# Patient Record
Sex: Female | Born: 1935 | Race: White | Hispanic: No | Smoking: Never smoker
Health system: Southern US, Community
[De-identification: ages and names within clinical notes are randomized; demographics above are authoritative.]

## PROBLEM LIST (undated history)

## (undated) DIAGNOSIS — I639 Cerebral infarction, unspecified: Secondary | ICD-10-CM

## (undated) DIAGNOSIS — I341 Nonrheumatic mitral (valve) prolapse: Secondary | ICD-10-CM

## (undated) DIAGNOSIS — I10 Essential (primary) hypertension: Secondary | ICD-10-CM

## (undated) DIAGNOSIS — M069 Rheumatoid arthritis, unspecified: Secondary | ICD-10-CM

## (undated) DIAGNOSIS — S065X9A Traumatic subdural hemorrhage with loss of consciousness of unspecified duration, initial encounter: Secondary | ICD-10-CM

## (undated) DIAGNOSIS — S065XAA Traumatic subdural hemorrhage with loss of consciousness status unknown, initial encounter: Secondary | ICD-10-CM

## (undated) DIAGNOSIS — IMO0002 Reserved for concepts with insufficient information to code with codable children: Secondary | ICD-10-CM

## (undated) HISTORY — PX: OTHER SURGICAL HISTORY: SHX169

---

## 2008-09-18 ENCOUNTER — Encounter: Payer: Self-pay | Admitting: Cardiology

## 2008-10-06 ENCOUNTER — Encounter: Payer: Self-pay | Admitting: Cardiology

## 2008-10-07 DIAGNOSIS — I4949 Other premature depolarization: Secondary | ICD-10-CM

## 2008-10-09 ENCOUNTER — Ambulatory Visit: Payer: Self-pay | Admitting: Cardiology

## 2008-10-09 ENCOUNTER — Ambulatory Visit: Payer: Self-pay

## 2008-10-09 ENCOUNTER — Encounter: Payer: Self-pay | Admitting: Cardiology

## 2008-10-20 ENCOUNTER — Ambulatory Visit: Payer: Self-pay | Admitting: Cardiology

## 2008-10-20 ENCOUNTER — Encounter (INDEPENDENT_AMBULATORY_CARE_PROVIDER_SITE_OTHER): Payer: Self-pay | Admitting: *Deleted

## 2008-10-20 DIAGNOSIS — R002 Palpitations: Secondary | ICD-10-CM

## 2008-11-23 ENCOUNTER — Encounter (INDEPENDENT_AMBULATORY_CARE_PROVIDER_SITE_OTHER): Payer: Self-pay | Admitting: *Deleted

## 2008-12-21 ENCOUNTER — Ambulatory Visit: Payer: Self-pay | Admitting: Cardiology

## 2011-09-01 ENCOUNTER — Encounter (HOSPITAL_COMMUNITY)
Admission: AD | Disposition: A | Discharge status: Rehabilitation Facility | Payer: Self-pay | Source: Other Acute Inpatient Hospital | Attending: Neurological Surgery

## 2011-09-01 ENCOUNTER — Inpatient Hospital Stay (HOSPITAL_COMMUNITY): Payer: Medicare Other | Admitting: Certified Registered"

## 2011-09-01 ENCOUNTER — Encounter (HOSPITAL_COMMUNITY): Payer: Self-pay | Admitting: Surgery

## 2011-09-01 ENCOUNTER — Inpatient Hospital Stay (HOSPITAL_COMMUNITY)
Admission: AD | Admit: 2011-09-01 | Discharge: 2011-09-06 | DRG: 027 | Disposition: A | Discharge status: Rehabilitation Facility | Payer: Medicare Other | Source: Other Acute Inpatient Hospital | Attending: Neurological Surgery | Admitting: Neurological Surgery

## 2011-09-01 ENCOUNTER — Encounter (HOSPITAL_COMMUNITY): Payer: Self-pay | Admitting: Certified Registered"

## 2011-09-01 ENCOUNTER — Encounter (HOSPITAL_COMMUNITY): Payer: Self-pay | Admitting: Neurological Surgery

## 2011-09-01 DIAGNOSIS — I6200 Nontraumatic subdural hemorrhage, unspecified: Principal | ICD-10-CM | POA: Diagnosis present

## 2011-09-01 DIAGNOSIS — R4789 Other speech disturbances: Secondary | ICD-10-CM | POA: Diagnosis present

## 2011-09-01 DIAGNOSIS — R29810 Facial weakness: Secondary | ICD-10-CM | POA: Diagnosis present

## 2011-09-01 DIAGNOSIS — Z8673 Personal history of transient ischemic attack (TIA), and cerebral infarction without residual deficits: Secondary | ICD-10-CM

## 2011-09-01 HISTORY — DX: Nonrheumatic mitral (valve) prolapse: I34.1

## 2011-09-01 HISTORY — DX: Cerebral infarction, unspecified: I63.9

## 2011-09-01 HISTORY — DX: Traumatic subdural hemorrhage with loss of consciousness status unknown, initial encounter: S06.5XAA

## 2011-09-01 HISTORY — DX: Traumatic subdural hemorrhage with loss of consciousness of unspecified duration, initial encounter: S06.5X9A

## 2011-09-01 HISTORY — PX: BURR HOLE: SHX908

## 2011-09-01 HISTORY — DX: Essential (primary) hypertension: I10

## 2011-09-01 LAB — POCT I-STAT 4, (NA,K, GLUC, HGB,HCT): Glucose, Bld: 78 mg/dL (ref 70–99)

## 2011-09-01 LAB — MRSA PCR SCREENING: MRSA by PCR: NEGATIVE

## 2011-09-01 LAB — CBC
MCH: 30.5 pg (ref 26.0–34.0)
Platelets: 178 10*3/uL (ref 150–400)
RBC: 3.87 MIL/uL (ref 3.87–5.11)
RDW: 13 % (ref 11.5–15.5)
WBC: 5.5 10*3/uL (ref 4.0–10.5)

## 2011-09-01 LAB — COMPREHENSIVE METABOLIC PANEL
Albumin: 3.4 g/dL — ABNORMAL LOW (ref 3.5–5.2)
Alkaline Phosphatase: 62 U/L (ref 39–117)
BUN: 15 mg/dL (ref 6–23)
Calcium: 9.1 mg/dL (ref 8.4–10.5)
Potassium: 2.8 mEq/L — ABNORMAL LOW (ref 3.5–5.1)
Total Protein: 6.7 g/dL (ref 6.0–8.3)

## 2011-09-01 LAB — PROTIME-INR: Prothrombin Time: 16.3 seconds — ABNORMAL HIGH (ref 11.6–15.2)

## 2011-09-01 SURGERY — CREATION, CRANIAL BURR HOLE
Anesthesia: General | Site: Head | Laterality: Left | Wound class: Clean

## 2011-09-01 MED ORDER — PANTOPRAZOLE SODIUM 40 MG IV SOLR
40.0000 mg | Freq: Every day | INTRAVENOUS | Status: DC
  Administered 2011-09-01 – 2011-09-05 (×5): 40 mg via INTRAVENOUS
  Filled 2011-09-01 (×6): qty 40

## 2011-09-01 MED ORDER — MORPHINE SULFATE 2 MG/ML IJ SOLN
1.0000 mg | INTRAMUSCULAR | Status: DC | PRN
  Administered 2011-09-02 – 2011-09-03 (×2): 2 mg via INTRAVENOUS
  Filled 2011-09-01 (×2): qty 1

## 2011-09-01 MED ORDER — POTASSIUM CHLORIDE IN NACL 20-0.9 MEQ/L-% IV SOLN
INTRAVENOUS | Status: DC
  Administered 2011-09-01 – 2011-09-05 (×8): via INTRAVENOUS
  Filled 2011-09-01 (×10): qty 1000

## 2011-09-01 MED ORDER — 0.9 % SODIUM CHLORIDE (POUR BTL) OPTIME
TOPICAL | Status: DC | PRN
  Administered 2011-09-01 (×2): 1000 mL

## 2011-09-01 MED ORDER — LABETALOL HCL 5 MG/ML IV SOLN
10.0000 mg | INTRAVENOUS | Status: DC | PRN
  Administered 2011-09-01 – 2011-09-02 (×3): 10 mg via INTRAVENOUS
  Administered 2011-09-02: 20 mg via INTRAVENOUS
  Administered 2011-09-02 – 2011-09-04 (×3): 10 mg via INTRAVENOUS
  Filled 2011-09-01 (×7): qty 4

## 2011-09-01 MED ORDER — PROPOFOL 10 MG/ML IV BOLUS
INTRAVENOUS | Status: DC | PRN
  Administered 2011-09-01: 100 mg via INTRAVENOUS

## 2011-09-01 MED ORDER — ACETAMINOPHEN 650 MG RE SUPP: 650.0000 mg | RECTAL | Status: DC | PRN

## 2011-09-01 MED ORDER — THROMBIN 20000 UNITS EX KIT
PACK | CUTANEOUS | Status: DC | PRN
  Administered 2011-09-01: 16:00:00 via TOPICAL

## 2011-09-01 MED ORDER — CEFAZOLIN SODIUM 1-5 GM-% IV SOLN
INTRAVENOUS | Status: DC | PRN
  Administered 2011-09-01: 2 g via INTRAVENOUS

## 2011-09-01 MED ORDER — FENTANYL CITRATE 0.05 MG/ML IJ SOLN
INTRAMUSCULAR | Status: DC | PRN
  Administered 2011-09-01: 25 ug via INTRAVENOUS

## 2011-09-01 MED ORDER — LIDOCAINE-EPINEPHRINE 1 %-1:100000 IJ SOLN
INTRAMUSCULAR | Status: DC | PRN
  Administered 2011-09-01: 8 mL

## 2011-09-01 MED ORDER — EPHEDRINE SULFATE 50 MG/ML IJ SOLN
INTRAMUSCULAR | Status: DC | PRN
  Administered 2011-09-01: 10 mg via INTRAVENOUS

## 2011-09-01 MED ORDER — ONDANSETRON HCL 4 MG/2ML IJ SOLN
INTRAMUSCULAR | Status: DC | PRN
  Administered 2011-09-01: 4 mg via INTRAVENOUS

## 2011-09-01 MED ORDER — HALOPERIDOL LACTATE 5 MG/ML IJ SOLN
2.0000 mg | Freq: Once | INTRAMUSCULAR | Status: AC
  Administered 2011-09-01: 2 mg via INTRAVENOUS

## 2011-09-01 MED ORDER — TRIAMTERENE-HCTZ 37.5-25 MG PO TABS
1.0000 | ORAL_TABLET | Freq: Every day | ORAL | Status: DC
  Administered 2011-09-04 – 2011-09-06 (×3): 1 via ORAL
  Filled 2011-09-01 (×6): qty 1

## 2011-09-01 MED ORDER — POTASSIUM CHLORIDE 10 MEQ/100ML IV SOLN
10.0000 meq | INTRAVENOUS | Status: AC
  Administered 2011-09-01 (×3): 10 meq via INTRAVENOUS
  Filled 2011-09-01 (×3): qty 100

## 2011-09-01 MED ORDER — LACTATED RINGERS IV SOLN
INTRAVENOUS | Status: DC | PRN
  Administered 2011-09-01: 16:00:00 via INTRAVENOUS

## 2011-09-01 MED ORDER — LIDOCAINE HCL (CARDIAC) 20 MG/ML IV SOLN
INTRAVENOUS | Status: DC | PRN
  Administered 2011-09-01: 40 mg via INTRAVENOUS

## 2011-09-01 MED ORDER — SODIUM CHLORIDE 0.9 % IV SOLN
INTRAVENOUS | Status: DC
  Administered 2011-09-01: 04:00:00 via INTRAVENOUS

## 2011-09-01 MED ORDER — ACETAMINOPHEN 325 MG PO TABS: 650.0000 mg | ORAL_TABLET | ORAL | Status: DC | PRN

## 2011-09-01 MED ORDER — LEVOTHYROXINE SODIUM 75 MCG PO TABS
75.0000 ug | ORAL_TABLET | Freq: Every day | ORAL | Status: DC
  Administered 2011-09-04 – 2011-09-06 (×3): 75 ug via ORAL
  Filled 2011-09-01 (×8): qty 1

## 2011-09-01 MED ORDER — ONDANSETRON HCL 4 MG/2ML IJ SOLN: 4.0000 mg | Freq: Four times a day (QID) | INTRAMUSCULAR | Status: DC | PRN

## 2011-09-01 MED ORDER — SENNOSIDES-DOCUSATE SODIUM 8.6-50 MG PO TABS
1.0000 | ORAL_TABLET | Freq: Two times a day (BID) | ORAL | Status: DC
  Administered 2011-09-03 – 2011-09-06 (×5): 1 via ORAL
  Filled 2011-09-01 (×11): qty 1

## 2011-09-01 MED ORDER — HALOPERIDOL LACTATE 5 MG/ML IJ SOLN
INTRAMUSCULAR | Status: AC
  Administered 2011-09-01: 2 mg via INTRAVENOUS
  Filled 2011-09-01: qty 1

## 2011-09-01 SURGICAL SUPPLY — 53 items
BAG DECANTER FOR FLEXI CONT (MISCELLANEOUS) ×2 IMPLANT
BANDAGE GAUZE 4  KLING STR (GAUZE/BANDAGES/DRESSINGS) IMPLANT
BANDAGE GAUZE ELAST BULKY 4 IN (GAUZE/BANDAGES/DRESSINGS) IMPLANT
BENZOIN TINCTURE PRP APPL 2/3 (GAUZE/BANDAGES/DRESSINGS) IMPLANT
BLADE CLIPPER SURG NEURO (BLADE) ×2 IMPLANT
BRUSH SCRUB EZ 1% IODOPHOR (MISCELLANEOUS) IMPLANT
BUR ACORN 6.0 PRECISION (BURR) ×2 IMPLANT
CANISTER SUCTION 2500CC (MISCELLANEOUS) ×2 IMPLANT
CATH VENTRIC 35X38 W/TROCAR LG (CATHETERS) ×2 IMPLANT
CLIP TI MEDIUM 6 (CLIP) IMPLANT
CLOTH BEACON ORANGE TIMEOUT ST (SAFETY) ×2 IMPLANT
CONT SPEC 4OZ CLIKSEAL STRL BL (MISCELLANEOUS) ×2 IMPLANT
CORDS BIPOLAR (ELECTRODE) ×2 IMPLANT
DRAIN SNY WOU 7FLT (WOUND CARE) IMPLANT
DRAPE NEUROLOGICAL W/INCISE (DRAPES) ×2 IMPLANT
DRESSING TELFA 8X3 (GAUZE/BANDAGES/DRESSINGS) IMPLANT
DRSG OPSITE 4X5.5 SM (GAUZE/BANDAGES/DRESSINGS) ×2 IMPLANT
DURAPREP 26ML APPLICATOR (WOUND CARE) ×2 IMPLANT
DURAPREP 6ML APPLICATOR 50/CS (WOUND CARE) ×2 IMPLANT
ELECT CAUTERY BLADE 6.4 (BLADE) ×2 IMPLANT
ELECT REM PT RETURN 9FT ADLT (ELECTROSURGICAL) ×2
ELECTRODE REM PT RTRN 9FT ADLT (ELECTROSURGICAL) ×1 IMPLANT
EVACUATOR SILICONE 100CC (DRAIN) IMPLANT
GAUZE SPONGE 4X4 16PLY XRAY LF (GAUZE/BANDAGES/DRESSINGS) IMPLANT
GLOVE BIO SURGEON STRL SZ 6.5 (GLOVE) ×4 IMPLANT
GLOVE BIO SURGEON STRL SZ8 (GLOVE) ×2 IMPLANT
GLOVE BIOGEL PI IND STRL 7.0 (GLOVE) ×1 IMPLANT
GLOVE BIOGEL PI INDICATOR 7.0 (GLOVE) ×1
GOWN BRE IMP SLV AUR LG STRL (GOWN DISPOSABLE) ×2 IMPLANT
GOWN BRE IMP SLV AUR XL STRL (GOWN DISPOSABLE) ×2 IMPLANT
GOWN STRL REIN 2XL LVL4 (GOWN DISPOSABLE) ×2 IMPLANT
HEMOSTAT POWDER KIT SURGIFOAM (HEMOSTASIS) IMPLANT
KIT BASIN OR (CUSTOM PROCEDURE TRAY) ×2 IMPLANT
KIT DRAIN CSF ACCUDRAIN (MISCELLANEOUS) ×2 IMPLANT
KIT ROOM TURNOVER OR (KITS) ×2 IMPLANT
NEEDLE HYPO 22GX1.5 SAFETY (NEEDLE) ×2 IMPLANT
NS IRRIG 1000ML POUR BTL (IV SOLUTION) ×4 IMPLANT
PACK CRANIOTOMY (CUSTOM PROCEDURE TRAY) ×2 IMPLANT
PAD ARMBOARD 7.5X6 YLW CONV (MISCELLANEOUS) ×4 IMPLANT
PIN MAYFIELD SKULL DISP (PIN) IMPLANT
SPECIMEN JAR SMALL (MISCELLANEOUS) IMPLANT
SPONGE GAUZE 4X4 12PLY (GAUZE/BANDAGES/DRESSINGS) ×2 IMPLANT
STAPLER VISISTAT 35W (STAPLE) ×2 IMPLANT
SUT ETHILON 3 0 PS 1 (SUTURE) ×2 IMPLANT
SUT NURALON 4 0 TR CR/8 (SUTURE) ×2 IMPLANT
SUT VIC AB 2-0 CP2 18 (SUTURE) ×2 IMPLANT
SYR 20ML ECCENTRIC (SYRINGE) ×2 IMPLANT
SYR CONTROL 10ML LL (SYRINGE) ×2 IMPLANT
TOWEL OR 17X24 6PK STRL BLUE (TOWEL DISPOSABLE) ×2 IMPLANT
TOWEL OR 17X26 10 PK STRL BLUE (TOWEL DISPOSABLE) ×2 IMPLANT
TRAY FOLEY CATH 14FRSI W/METER (CATHETERS) IMPLANT
UNDERPAD 30X30 INCONTINENT (UNDERPADS AND DIAPERS) IMPLANT
WATER STERILE IRR 1000ML POUR (IV SOLUTION) ×2 IMPLANT

## 2011-09-01 NOTE — Anesthesia Procedure Notes (Addendum)
Procedure Name: LMA Insertion Date/Time: 09/01/2011 4:05 PM Performed by: Jerilee Hoh Pre-anesthesia Checklist: Patient identified, Emergency Drugs available, Suction available and Patient being monitored Patient Re-evaluated:Patient Re-evaluated prior to inductionOxygen Delivery Method: Circle system utilized Intubation Type: IV induction LMA: LMA inserted LMA Size: 4.0 Tube type: Oral Number of attempts: 1 Placement Confirmation: positive ETCO2 and breath sounds checked- equal and bilateral Tube secured with: Tape Dental Injury: Teeth and Oropharynx as per pre-operative assessment  Comments: Patient confused and combative preop.  Unable to apply monitors prior to induction and unable to preoxygenate.  Monitors applied immediately after induction and prior to LMA insertion.  VSS.  Atraumatic LMA placement.

## 2011-09-01 NOTE — Op Note (Signed)
09/01/2011  4:59 PM  PATIENT:  [name]  78 y.o. female  PRE-OPERATIVE DIAGNOSIS:  Left chronic subdural hematoma  POST-OPERATIVE DIAGNOSIS:  Same  PROCEDURE:  Left frontal bur hole with placement of subdural drain  SURGEON:  Marikay Alar, MD  ASSISTANTS: none  ANESTHESIA:   General  EBL: Minimal ml  Total I/O In: 843.8 [I.V.:543.8; IV Piggyback:300] Out: 400 [Urine:400]  BLOOD ADMINISTERED:none  DRAINS: Subdural drain   SPECIMEN:  No Specimen  INDICATION FOR PROCEDURE: This is a patient who presented after falling about 3 months ago. She had a CT scan done at [facility] by month ago showed bilateral chronic subdural hematomas. She began to become symptomatic from these with intermittent difficulty with speech and headache. Followup CT confirm the presence of minimally increased bilateral subdural hematomas. Recommended a left-sided burr hole for subdural hematoma. Patient understood the risks, benefits, and alternatives and potential outcomes and wished to proceed.  PROCEDURE DETAILS: The patient was taken to the operating room and after induction of adequate generalized endotracheal anesthesia, the head was turned to the  right to expose the left frontotemporal parietal region. The head was shaved and then cleaned and then prepped with DuraPrep and draped in the usual sterile fashion. 10 cc of local anesthetic was injected, and a small linear incision was made on the left side of the head in the frontotemporal region. Hemostasis was obtained and a small self-retaining retractor was used. A burr hole was placed with a air powered drill and then enlarged with a small Kerrison punch. The dura was opened and A hematoma was then removed with a combination of irrigation and suction. I continued to irrigate until the irrigant was clear, and dried any bleeding with bipolar cautery. I fenestrated the membranes until the cortex was visible. I then placed a subdural drain through  separate stab incision.  The dura was lined with Gelfoam.  the galea was then closed with interrupted 2-0 Vicryl suture. The skin was then closed with staples a sterile dressing was applied. She was awakened from general anesthesia, and transported to the recovery room in stable condition. At the end of the procedure all sponge, needle, and instrument counts were correct.   PLAN OF CARE: Admit to inpatient   PATIENT DISPOSITION:  PACU - hemodynamically stable.   Delay start of Pharmacological VTE agent (>24hrs) due to surgical blood loss or risk of bleeding:  yes

## 2011-09-01 NOTE — Progress Notes (Signed)
INITIAL ADULT NUTRITION ASSESSMENT Date: 09/01/2011   Time: 10:50 AM Reason for Assessment: Nutrition Risk - weight loss/appears malnourished  INTERVENTION: Supplement diet once advanced.    ASSESSMENT: Female 78 y.o.  Dx: Bilateral subdural hematomas  Hx: No past medical history on file. No past surgical history on file.  Related Meds:     . pantoprazole (PROTONIX) IV  40 mg Intravenous QHS  . potassium chloride  10 mEq Intravenous Q1 Hr x 3  . senna-docusate  1 tablet Oral BID   Ht: 5\' 3"  (160 cm)  Wt: 100 lb 8.5 oz (45.6 kg)  Ideal Wt: 52.2 kg % Ideal Wt: 87%  Usual Wt:  Wt Readings from Last 10 Encounters:  09/01/11 100 lb 8.5 oz (45.6 kg)  09/01/11 100 lb 8.5 oz (45.6 kg)  12/21/08 115 lb (52.164 kg)  10/20/08 119 lb (53.978 kg)  10/09/08 115 lb (52.164 kg)    % Usual Wt:    Body mass index is 17.81 kg/(m^2). Underweight  Food/Nutrition Related Hx: Unable to determine at this time. Pt is confused and unable to answer any questions. Pt does appear to have temporal wasting. No family present at this time.   Labs:  CMP     Component Value Date/Time   NA 144 09/01/2011 0520   K 2.8* 09/01/2011 0520   CL 105 09/01/2011 0520   CO2 30 09/01/2011 0520   GLUCOSE 89 09/01/2011 0520   BUN 15 09/01/2011 0520   CREATININE 0.69 09/01/2011 0520   CALCIUM 9.1 09/01/2011 0520   PROT 6.7 09/01/2011 0520   ALBUMIN 3.4* 09/01/2011 0520   AST 20 09/01/2011 0520   ALT 9 09/01/2011 0520   ALKPHOS 62 09/01/2011 0520   BILITOT 0.5 09/01/2011 0520   GFRNONAA 78* 09/01/2011 0520   GFRAA >90 09/01/2011 0520  CBG (last 3)  No results found for this basename: GLUCAP:3 in the last 72 hours  Intake/Output Summary (Last 24 hours) at 09/01/11 1058 Last data filed at 09/01/11 0905  Gross per 24 hour  Intake 211.67 ml  Output      0 ml  Net 211.67 ml    Diet Order: NPO  Supplements/Tube Feeding: none  IVF:    sodium chloride Last Rate: 50 mL/hr at 09/01/11 0800  0.9 % NaCl with KCl 20 mEq / L  Last Rate: 75 mL/hr at 09/01/11 0905   Pt with bilateral chronic subdural hematomas. Per RN plan for burr hole today in the OR after MD can discuss with family.   Estimated Nutritional Needs:   Kcal:  1300-1500 Protein:  65-75 grams Fluid:  >1.5 L/day  NUTRITION DIAGNOSIS: -Inadequate oral intake (NI-2.1).  Status: Ongoing  RELATED TO: inability to eat  AS EVIDENCE BY: NPO status  MONITORING/EVALUATION(Goals): Goal: Pt to meet >/= 90% of their estimated nutrition needs. Monitor: Diet advancement/tolerance, PO intake  EDUCATION NEEDS: -No education needs identified at this time   DOCUMENTATION CODES Per approved criteria  -Underweight    Kendell Bane RD, LDN, CNSC [phone] Pager [phone] After Hours Pager  09/01/2011, 10:50 AM

## 2011-09-01 NOTE — Anesthesia Postprocedure Evaluation (Signed)
  Anesthesia Post-op Note  Patient: [name]  Procedure(s) Performed: Procedure(s) (LRB): BURR HOLES (Left)  Patient Location: PACU  Anesthesia Type: General  Level of Consciousness: awake, pateint uncooperative and confused  Airway and Oxygen Therapy: Patient Spontanous Breathing  Post-op Pain: none  Post-op Assessment: Post-op Vital signs reviewed, Patient's Cardiovascular Status Stable, Respiratory Function Stable, Patent Airway and No signs of Nausea or vomiting  Post-op Vital Signs: Reviewed and stable  Complications: No apparent anesthesia complications

## 2011-09-01 NOTE — Progress Notes (Signed)
Pt taken to OR for Left burr holes, report given to [facility]. Family in waiting room. [place], [place] M

## 2011-09-01 NOTE — Anesthesia Preprocedure Evaluation (Addendum)
Anesthesia Evaluation  Patient identified by MRN, date of birth, ID band Patient confused    Reviewed: Allergy & Precautions, H&P , NPO status , Patient's Chart, lab work & pertinent test results, reviewed documented beta blocker date and time   Airway Mallampati: II TM Distance: >3 FB Neck ROM: Full    Dental No notable dental hx. (+) Teeth Intact and Dental Advisory Given   Pulmonary neg pulmonary ROS,    Pulmonary exam normal       Cardiovascular hypertension, Pt. on medications negative cardio ROS      Neuro/Psych Subdural hematoma Pt. confused CVA, Residual Symptoms negative psych ROS   GI/Hepatic negative GI ROS, Neg liver ROS,   Endo/Other  negative endocrine ROS  Renal/GU negative Renal ROS  negative genitourinary   Musculoskeletal   Abdominal   Peds  Hematology negative hematology ROS (+)   Anesthesia Other Findings   Reproductive/Obstetrics negative OB ROS                          Anesthesia Physical Anesthesia Plan  ASA: III  Anesthesia Plan: General   Post-op Pain Management:    Induction: Intravenous  Airway Management Planned: LMA  Additional Equipment:   Intra-op Plan:   Post-operative Plan: Extubation in OR  Informed Consent: I have reviewed the patients History and Physical, chart, labs and discussed the procedure including the risks, benefits and alternatives for the proposed anesthesia with the patient or authorized representative who has indicated his/her understanding and acceptance.   Dental advisory given  Plan Discussed with: Anesthesiologist and Surgeon  Anesthesia Plan Comments:         Anesthesia Quick Evaluation

## 2011-09-01 NOTE — H&P (Signed)
Reason for Consult: Bilateral subdural hematomas Referring Physician: EDP  [name] is an 78 y.o. female.   HPI:  78 year old female who had a previous fall. She was seen in [facility] about a month ago and a CT scan of the head showed bilateral small chronic subdural hematomas. Appears today she had some headache and facial droop and difficulty with speech and was taken back to the [facility] emergency department. Head CT revealed a bilateral chronic subdural hematomas. I was called and asked if I would take care of her by Dr. Juanito Doom, who is a family friend. She was transferred for neurosurgical care. She denies weakness but does complain of some headache in the frontal region.  No past medical history on file.  No past surgical history on file.  Not on File  History  Substance Use Topics  . Smoking status: Not on file  . Smokeless tobacco: Not on file  . Alcohol Use: Not on file    No family history on file.   Review of Systems  Positive ROS: neg  All other systems have been reviewed and were otherwise negative with the exception of those mentioned in the HPI and as above.  Objective: Vital signs in last 24 hours: Temp:  [98.5 F (36.9 C)] 98.5 F (36.9 C) (08/09 0233) Pulse Rate:  [54-58] 54  (08/09 0315) Resp:  [12-18] 17  (08/09 0315) BP: (157-176)/(61-83) 172/61 mmHg (08/09 0315) SpO2:  [96 %-97 %] 96 % (08/09 0315) Weight:  [45.6 kg (100 lb 8.5 oz)] 45.6 kg (100 lb 8.5 oz) (08/09 0233)  General Appearance: Alert, cooperative, no distress, appears stated age Head: Normocephalic, without obvious abnormality, atraumatic Eyes: PERRL, conjunctiva/corneas clear, EOM's intact     Ears: Normal TM's and external ear canals, both ears Throat: benign Neck: Supple, symmetrical Lungs: Clear to auscultation bilaterally, respirations unlabored Heart: Regular rate and rhythm, S1 and S2 normal, no murmur, rub or gallop Abdomen: Soft,  non-tender Extremities: Extremities normal, atraumatic, no cyanosis or edema Pulses: 2+ and symmetric all extremities Skin: Skin color, texture, turgor normal, no rashes or lesions  NEUROLOGIC:   Mental status: A&O x4, no significant aphasia at present with fair naming good fluency good repeating, good attention span, Memory and fund of knowledge appear okay Motor Exam - grossly normal, normal tone and bulk for age, no drift Sensory Exam - grossly normal Reflexes: symmetric, no pathologic reflexes, No Hoffman's, No clonus Coordination - grossly normal Gait -not tested Balance - not tested Cranial Nerves: I: smell Not tested  II: visual acuity  OS: na    OD: na  II: visual fields Full to confrontation  II: pupils Equal, round, reactive to light  III,VII: ptosis None  III,IV,VI: extraocular muscles  Full ROM  V: mastication Normal  V: facial light touch sensation  Normal  V,VII: corneal reflex  Present  VII: facial muscle function - upper  Normal  VII: facial muscle function - lower Normal  VIII: hearing Not tested  IX: soft palate elevation  Normal  IX,X: gag reflex Present  XI: trapezius strength  5/5  XI: sternocleidomastoid strength 5/5  XI: neck flexion strength  5/5  XII: tongue strength  Normal    Data Review No results found for this basename: WBC, HGB, HCT, MCV, PLT   No results found for this basename: NA, K, CL, CO2, BUN, creatinine, glucose   No results found for this basename: INR, PROTIME    Radiology: No results found. CT  scan: Shows bilateral chronic subdural hematomas left greater than right measuring about 12 mm with some local mass effect but no midline shift, basal cisterns are open  Assessment/Plan: This is an elderly lady with bilateral chronic subdural hematomas. Appears she has some intermittent difficulty with speech and headaches. This probably requires a burr hole for drainage though I don't think it needs to be done emergently and there is no  family available at this time to discuss this matter. Will discuss with family when they arrive and determine our treatment plan, but I would favor either a left-sided burr hole or bilateral bur holes for drainage, potentially later today.   [name] S 09/01/2011 3:29 AM

## 2011-09-01 NOTE — Transfer of Care (Signed)
Immediate Anesthesia Transfer of Care Note  Patient: [name]  Procedure(s) Performed: Procedure(s) (LRB): BURR HOLES (Left)  Patient Location: PACU  Anesthesia Type: General  Level of Consciousness: awake, alert , oriented and patient cooperative  Airway & Oxygen Therapy: Patient Spontanous Breathing and Patient connected to nasal cannula oxygen  Post-op Assessment: Report given to PACU RN, Post -op Vital signs reviewed and stable and Patient moving all extremities  Post vital signs: Reviewed and stable  Complications: No apparent anesthesia complications

## 2011-09-01 NOTE — Preoperative (Signed)
Beta Blockers   Reason not to administer Beta Blockers:Not Applicable 

## 2011-09-01 NOTE — Progress Notes (Signed)
UR complete 

## 2011-09-01 NOTE — Plan of Care (Signed)
Problem: Consults Goal: Diagnosis - Craniotomy Outcome: Completed/Met Date Met:  09/01/11 Bilateral Subdural hematomas. Left [name] hole

## 2011-09-01 NOTE — Progress Notes (Signed)
Dr Yetta Barre notified of pt's increase in confusion, agitation and difficulty following instructions.  No new orders given.  Will continue to monitor.  Dara Hoyer

## 2011-09-02 ENCOUNTER — Inpatient Hospital Stay (HOSPITAL_COMMUNITY): Payer: Medicare Other

## 2011-09-02 LAB — BASIC METABOLIC PANEL
BUN: 10 mg/dL (ref 6–23)
CO2: 29 mEq/L (ref 19–32)
Chloride: 104 mEq/L (ref 96–112)
Glucose, Bld: 96 mg/dL (ref 70–99)
Potassium: 3.3 mEq/L — ABNORMAL LOW (ref 3.5–5.1)
Sodium: 142 mEq/L (ref 135–145)

## 2011-09-02 NOTE — Progress Notes (Signed)
Subjective: Patient confused  Objective: Vital signs in last 24 hours: Temp:  [96.8 F (36 C)-98 F (36.7 C)] 97.4 F (36.3 C) (08/10 0800) Pulse Rate:  [47-101] 59  (08/10 0800) Resp:  [12-37] 12  (08/10 0800) BP: (135-173)/(45-101) 147/52 mmHg (08/10 0800) SpO2:  [93 %-100 %] 100 % (08/10 0800)  Intake/Output from previous day: 08/09 0701 - 08/10 0700 In: 2303.8 [I.V.:1993.8; IV Piggyback:310] Out: 1984 [Urine:1850; Drains:134] Intake/Output this shift: Total I/O In: 75 [I.V.:75] Out: 7 [Drains:7]  PE - AAOx1 (name) - moves all 4 well Wound:c/d/i, drain intact   Lab Results:  Basename 09/01/11 1551 09/01/11 0520  WBC -- 5.5  HGB 11.6* 11.8*  HCT 34.0* 35.0*  PLT -- 178   BMET  Basename 09/02/11 0435 09/01/11 1551 09/01/11 0520  NA 142 144 --  K 3.3* 3.5 --  CL 104 -- 105  CO2 29 -- 30  GLUCOSE 96 78 --  BUN 10 -- 15  CREATININE 0.71 -- 0.69  CALCIUM 8.9 -- 9.1    Studies/Results: Ct Head Wo Contrast  09/02/2011  *RADIOLOGY REPORT*  Clinical Data: Followup subdural hematoma.  CT HEAD WITHOUT CONTRAST  Technique:  Contiguous axial images were obtained from the base of the skull through the vertex without contrast.  Comparison: No comparison exams available.  The patient was imaged on the outside hospital.  Findings: Post left frontal burr hole with placement of a left subdural drain.  Small to moderate amount of left-sided pneumocephalus.  Residual left-sided subdural collection with maximal thickness of 7.3 mm.  Right-sided complex subdural hematoma with fluid level suggesting acute on top of chronic hemorrhage with maximal thickness of 13.5 mm.  Relative balanced mass effect.  Minimal bowing of the septum to the right.  Ventricular prominence.  This may be related to atrophy.  Mild component of hydrocephalus not excluded.  Attention to this as subdural hematoma is clear.  No CT evidence of large acute infarct.  No intracranial mass lesion detected on this  unenhanced exam.  IMPRESSION:  Post left frontal burr hole with placement of a left subdural drain.  Small to moderate amount of left-sided pneumocephalus. Residual left-sided subdural collection with maximal thickness of 7.3 mm.  Right-sided complex subdural hematoma with fluid level suggesting acute on top of chronic hemorrhage with maximal thickness of 13.5 mm.  Critical Value/emergent results were called by telephone at the time of interpretation on 09/02/2011 at 8:19 a.m. to [facility] patient's nurse, who verbally acknowledged these results.  Original Report Authenticated By: Fuller Canada, M.D.    Assessment/Plan: Pt confused - otherwise stable - CT looks as expected - continue drain - increase HOB today - ?start therapies in am?  LOS: 1 day     Izzy Doubek R, MD 09/02/2011, 8:46 AM

## 2011-09-03 LAB — BASIC METABOLIC PANEL
BUN: 13 mg/dL (ref 6–23)
CO2: 26 mEq/L (ref 19–32)
Chloride: 106 mEq/L (ref 96–112)
Creatinine, Ser: 0.67 mg/dL (ref 0.50–1.10)
GFR calc Af Amer: >90 mL/min (ref >90)
Glucose, Bld: 104 mg/dL — ABNORMAL HIGH (ref 70–99)
Potassium: 3.6 mEq/L (ref 3.5–5.1)

## 2011-09-03 NOTE — Progress Notes (Signed)
Subjective: Patient confused  Objective: Vital signs in last 24 hours: Temp:  [96.9 F (36.1 C)-98.7 F (37.1 C)] 98.6 F (37 C) (08/11 0400) Pulse Rate:  [27-91] 68  (08/11 0000) Resp:  [12-26] 16  (08/11 0700) BP: (136-205)/(51-116) 157/51 mmHg (08/11 0700) SpO2:  [93 %-100 %] 98 % (08/11 0000) Weight:  [45.9 kg (101 lb 3.1 oz)] 45.9 kg (101 lb 3.1 oz) (08/11 0500)  Intake/Output from previous day: 08/10 0701 - 08/11 0700 In: 1575 [I.V.:1575] Out: 699 [Urine:600; Drains:99] Intake/Output this shift:    PE - AAOx1 at times - moves all 4  Lab Results:  [facility] 09/01/11 1551 09/01/11 0520  WBC -- 5.5  HGB 11.6* 11.8*  HCT 34.0* 35.0*  PLT -- 178   BMET  Basename 09/03/11 0411 09/02/11 0435  NA 144 142  K 3.6 3.3*  CL 106 104  CO2 26 29  GLUCOSE 104* 96  BUN 13 10  CREATININE 0.67 0.71  CALCIUM 9.0 8.9    Studies/Results: Ct Head Wo Contrast  09/02/2011  *RADIOLOGY REPORT*  Clinical Data: Followup subdural hematoma.  CT HEAD WITHOUT CONTRAST  Technique:  Contiguous axial images were obtained from the base of the skull through the vertex without contrast.  Comparison: No comparison exams available.  The patient was imaged on the outside hospital.  Findings: Post left frontal burr hole with placement of a left subdural drain.  Small to moderate amount of left-sided pneumocephalus.  Residual left-sided subdural collection with maximal thickness of 7.3 mm.  Right-sided complex subdural hematoma with fluid level suggesting acute on top of chronic hemorrhage with maximal thickness of 13.5 mm.  Relative balanced mass effect.  Minimal bowing of the septum to the right.  Ventricular prominence.  This may be related to atrophy.  Mild component of hydrocephalus not excluded.  Attention to this as subdural hematoma is clear.  No CT evidence of large acute infarct.  No intracranial mass lesion detected on this unenhanced exam.  IMPRESSION:  Post left frontal burr hole with placement  of a left subdural drain.  Small to moderate amount of left-sided pneumocephalus. Residual left-sided subdural collection with maximal thickness of 7.3 mm.  Right-sided complex subdural hematoma with fluid level suggesting acute on top of chronic hemorrhage with maximal thickness of 13.5 mm.  Critical Value/emergent results were called by telephone at the time of interpretation on 09/02/2011 at 8:19 a.m. to [facility] patient's nurse, who verbally acknowledged these results.  Original Report Authenticated By: Fuller Canada, M.D.    Assessment/Plan: Pt stable  - CPM - increase activity  - rehab vs SNF  LOS: 2 days     Eber Ferrufino R, MD 09/03/2011, 7:56 AM

## 2011-09-03 NOTE — Evaluation (Signed)
Clinical/Bedside Swallow Evaluation Patient Details  Name: [name] MRN: [id number] Date of Birth: 06/30/1926  Today's Date: 09/03/2011 Time: 1346-     Past Medical History:  Past Medical History  Diagnosis Date  . Subdural hematoma   . Hypertension   . Mitral valvular prolapse   . CVA (cerebrovascular accident)    Past Surgical History: History reviewed. No pertinent past surgical history. HPI:  78 year old female who had a previous fall. She was seen in [facility] about a month ago and a CT scan of the head showed bilateral small chronic subdural hematomas. Appears today she had some headache and facial droop and difficulty with speech and was taken back to the [facility] emergency department. Head CT revealed a bilateral chronic subdural hematomas.  Now s/p burr hole and placement of drain in left frontal lobe.   Assessment / Plan / Recommendation Clinical Impression  Patient exhibits no overt s/s of aspiration.  Only mild oral delay noted when chewing solids, otherwise swallow function appears to be intackt.    Aspiration Risk  Mild    Diet Recommendation Dysphagia 3 (Mechanical Soft);Thin liquid   Liquid Administration via: Straw Medication Administration: Whole meds with liquid Supervision: Staff feed patient;Full supervision/cueing for compensatory strategies Compensations: Slow rate;Small sips/bites Postural Changes and/or Swallow Maneuvers: Seated upright 90 degrees    Other  Recommendations Oral Care Recommendations: Oral care QID;Staff/trained caregiver to provide oral care Other Recommendations: Clarify dietary restrictions   Follow Up Recommendations  Inpatient Rehab    Frequency and Duration min 2x/week  2 weeks   Pertinent Vitals/Pain n/a    SLP Swallow Goals Patient will consume recommended diet without observed clinical signs of aspiration with: Minimal assistance;Set-up;Supervision/safety   Swallow Study Prior Functional  Status       General HPI: 78 year old female who had a previous fall. She was seen in [facility] about a month ago and a CT scan of the head showed bilateral small chronic subdural hematomas. Appears today she had some headache and facial droop and difficulty with speech and was taken back to the [facility] emergency department. Head CT revealed a bilateral chronic subdural hematomas.  Now s/p burr hole and placement of drain in left frontal lobe. Type of Study: Bedside swallow evaluation Diet Prior to this Study: NPO;IV Temperature Spikes Noted: No Respiratory Status: Other (comment) Behavior/Cognition: Alert;Cooperative;Pleasant mood;Confused;Distractible;Requires cueing;Decreased sustained attention Oral Cavity - Dentition: Adequate natural dentition Self-Feeding Abilities: Needs assist Patient Positioning: Upright in bed Baseline Vocal Quality: Clear Volitional Cough: Strong Volitional Swallow: Able to elicit    Oral/Motor/Sensory Function Overall Oral Motor/Sensory Function: Appears within functional limits for tasks assessed   Ice Chips Ice chips: Within functional limits Presentation: Spoon   Thin Liquid Thin Liquid: Within functional limits Presentation: Cup;Straw;Spoon    Nectar Thick Nectar Thick Liquid: Not tested   Honey Thick     Puree Puree: Within functional limits Presentation: Spoon   Solid   GO    Solid: Impaired Oral Phase Impairments: Reduced lingual movement/coordination (Slow mastication)       [name] T 09/03/2011,2:26 PM

## 2011-09-04 ENCOUNTER — Encounter (HOSPITAL_COMMUNITY): Payer: Self-pay | Admitting: Neurological Surgery

## 2011-09-04 MED ORDER — DOCUSATE SODIUM 283 MG RE ENEM
1.0000 | ENEMA | Freq: Every day | RECTAL | Status: DC | PRN
  Filled 2011-09-04: qty 1

## 2011-09-04 MED ORDER — BISACODYL 10 MG RE SUPP: 10.0000 mg | Freq: Every day | RECTAL | Status: DC | PRN

## 2011-09-04 MED ORDER — DOCUSATE SODIUM 100 MG PO CAPS
100.0000 mg | ORAL_CAPSULE | Freq: Every day | ORAL | Status: DC | PRN
  Filled 2011-09-04: qty 1

## 2011-09-04 MED ORDER — BISACODYL 10 MG RE SUPP
10.0000 mg | Freq: Once | RECTAL | Status: AC
  Administered 2011-09-04: 10 mg via RECTAL
  Filled 2011-09-04: qty 1

## 2011-09-04 NOTE — Progress Notes (Signed)
Speech Language Pathology Dysphagia Treatment Patient Details Name: [name] MRN: [id number] DOB: Feb 29, 1926 Today's Date: 09/04/2011 Time: 1030-1040 SLP Time Calculation (min): 10 min  Assessment / Plan / Recommendation Clinical Impression  Pt with no overt signs ofa aspiraiton with limited Po intake. Pt refused most POs due to abdominal pain. RN and sitter NT report pt fed herslef breakfast and has taken mads without difficutly. Swallow function WFL, no SLp f/u needed for dysphagia. Will continue to follow for cognition .     Diet Recommendation  Continue with Current Diet: Regular;Thin liquid    SLP Plan Discharge SLP treatment due to (comment);All goals met   Pertinent Vitals/Pain NA   Swallowing Goals  SLP Swallowing Goals Patient will consume recommended diet without observed clinical signs of aspiration with: Minimal assistance;Set-up;Supervision/safety Swallow Study Goal #1 - Progress: Met  General Temperature Spikes Noted: No Respiratory Status: Supplemental O2 delivered via (comment) Behavior/Cognition: Alert;Cooperative;Pleasant mood;Confused;Distractible;Requires cueing;Decreased sustained attention Oral Cavity - Dentition: Dentures, top;Dentures, bottom Patient Positioning: Upright in bed  Oral Cavity - Oral Hygiene Does patient have any of the following "at risk" factors?: None of the above   Dysphagia Treatment Treatment focused on: Skilled observation of diet tolerance Treatment Methods/Modalities: Skilled observation Patient observed directly with PO's: Yes Type of PO's observed: Thin liquids Feeding: Able to feed self Liquids provided via: Straw   GO     [name], [name] 09/04/2011, 11:24 AM

## 2011-09-04 NOTE — Progress Notes (Signed)
Subjective: Patient confused  Objective: Vital signs in last 24 hours: Temp:  [98.1 F (36.7 C)-98.7 F (37.1 C)] 98.5 F (36.9 C) (08/12 0751) Pulse Rate:  [53-64] 56  (08/12 0700) Resp:  [13-21] 17  (08/12 0700) BP: (125-179)/(40-97) 131/47 mmHg (08/12 0700) SpO2:  [93 %-100 %] 97 % (08/12 0700)  Intake/Output from previous day: 08/11 0701 - 08/12 0700 In: 1920 [P.O.:120; I.V.:1800] Out: 378 [Urine:375; Drains:3] Intake/Output this shift:    PE - AAOx1 - moves all well, conversant but confused  Lab Results:  Basename 09/01/11 1551  WBC --  HGB 11.6*  HCT 34.0*  PLT --   BMET  Basename 09/03/11 0411 09/02/11 0435  NA 144 142  K 3.6 3.3*  CL 106 104  CO2 26 29  GLUCOSE 104* 96  BUN 13 10  CREATININE 0.67 0.71  CALCIUM 9.0 8.9    Studies/Results: No results found.  Assessment/Plan: Drain d/c'ed - increase activity - placement  LOS: 3 days     Erric Machnik R, MD 09/04/2011, 7:56 AM

## 2011-09-04 NOTE — Evaluation (Addendum)
Speech Language Pathology Evaluation Patient Details Name: [name] MRN: [id number] DOB: [birth year] Today's Date: 09/04/2011 Time: 4098-1191 SLP Time Calculation (min): 42 min  Problem List:  Patient Active Problem List  Diagnosis  . PREMATURE VENTRICULAR CONTRACTIONS  . PALPITATIONS   Past Medical History:  Past Medical History  Diagnosis Date  . Subdural hematoma   . Hypertension   . Mitral valvular prolapse   . CVA (cerebrovascular accident)    Past Surgical History: History reviewed. No pertinent past surgical history. HPI:  78 year old female who had a previous fall. She was seen in [facility] about a month ago and a CT scan of the head showed bilateral small chronic subdural hematomas. Appears today she had some headache and facial droop and difficulty with speech and was taken back to the [facility] emergency department. Head CT revealed a bilateral chronic subdural hematomas.  Now s/p burr hole and placement of drain in left frontal lobe. no family available by phone for infomration regarding baseline function.    Assessment / Plan / Recommendation Clinical Impression  Pt demonstrated moderate cognitive linguistic deficits including decreased sustained/selective attention (distracted by pain), limited working/short term memory, Word finding with circumlocutions impaired basic verbal and functional problem solving. The pt will need acute SLP for increased safety and independnece with communication and functional ADLs prior to d/c to next level of care. Recommend CIR at d/c depending on family support. ADDENDUM: family reports they hope for short term skilled nursing rehab  And then home with 24 hour care. Pt with severe physical deficits, unlikely to tolerate intensive PT. Unless PT recommends CIR, SLP at SNF most beneficial.     SLP Assessment  Patient needs continued Speech Lanaguage Pathology Services    Follow Up Recommendations  SNF    Frequency  and Duration min 2x/week  2 weeks   Pertinent Vitals/Pain NA   SLP Goals  SLP Goals SLP Goal #1: Pt will sustain attention to basic functional task with moderate contextual cues through completion  SLP Goal #2: Pt will answer open ended questions accurately with moderate verbal cues for word finding.  SLP Goal #3: Pt will verbalize orientation to place and situation and intellectual awareness of deficits with moderate verbal cues.   SLP Evaluation Prior Functioning  Cognitive/Linguistic Baseline: Information not available Lives With: Alone (per pt) Available Help at Discharge: Other (Comment) (unknown)   Cognition  Overall Cognitive Status: Impaired Arousal/Alertness: Awake/alert Orientation Level: Oriented to person;Disoriented to place;Disoriented to time;Oriented to situation Attention: Focused;Sustained;Selective Focused Attention: Appears intact Sustained Attention: Impaired Sustained Attention Impairment: Verbal complex;Functional complex Selective Attention: Impaired Selective Attention Impairment: Verbal complex;Functional complex Memory: Impaired Memory Impairment: Decreased recall of new information;Decreased short term memory;Storage deficit;Retrieval deficit Decreased Short Term Memory: Verbal basic;Functional basic Awareness: Impaired Awareness Impairment: Intellectual impairment;Emergent impairment;Anticipatory impairment Problem Solving: Impaired Problem Solving Impairment: Verbal basic;Functional basic Safety/Judgment: Impaired    Comprehension  Auditory Comprehension Overall Auditory Comprehension: Impaired Yes/No Questions: Within Functional Limits Commands: Impaired One Step Basic Commands: 75-100% accurate Two Step Basic Commands: 50-74% accurate Multistep Basic Commands: 0-24% accurate Conversation: Simple Other Conversation Comments: HOH, requires repetition, simplified speech Interfering Components: Attention;Pain;Hearing;Processing speed;Working  memory EffectiveTechniques: Extra processing time;Pausing;Repetition;Slowed speech;Stressing words;Visual/Gestural cues;Increased volume    Expression Verbal Expression Overall Verbal Expression: Impaired Initiation: No impairment Automatic Speech: Name;Social Response;Counting Level of Generative/Spontaneous Verbalization: Word;Phrase (sentence and conversation impaired by anomia, circumlocution) Repetition: No impairment Naming: Impairment Responsive: Not tested Confrontation: Impaired Convergent: 25-49% accurate Divergent: 75-100% accurate Verbal Errors:  Other (comment);Phonemic paraphasias (circumlocutions) Pragmatics: No impairment Interfering Components: Attention Effective Techniques: Semantic cues;Sentence completion   Oral / Motor Oral Motor/Sensory Function Overall Oral Motor/Sensory Function: Appears within functional limits for tasks assessed   GO     [name], Riley Nearing 09/04/2011, 11:14 AM

## 2011-09-04 NOTE — Evaluation (Signed)
Physical Therapy Evaluation Patient Details Name: [name] MRN: [id number] DOB: [birth year] Today's Date: 09/04/2011 Time: 5621-3086 PT Time Calculation (min): 28 min  PT Assessment / Plan / Recommendation Clinical Impression  pt admitted with bilateral chronic SAH.  pt s/p bur hole drainage of L SAH. R remains. On cval, pt is very confused and impulsive.  She is generally weak and not well coordinated.  Pt will need 24 hour assist either at home with HHPT vs SNF if family can not offer 24/7 care.    PT Assessment  Patient needs continued PT services    Follow Up Recommendations  Home health PT;Skilled nursing facility;Supervision/Assistance - 24 hour    Barriers to Discharge        Equipment Recommendations  Defer to next venue    Recommendations for Other Services OT consult   Frequency Min 3X/week    Precautions / Restrictions Precautions Precautions: Fall   Pertinent Vitals/Pain       Mobility  Bed Mobility Bed Mobility: Supine to Sit;Sitting - Scoot to Edge of Bed Supine to Sit: 4: Min assist Sitting - Scoot to [place] of Bed: 4: Min assist Details for Bed Mobility Assistance: vc's for safety and motivation; truncal assist to come up Transfers Transfers: Sit to Stand;Stand to [employer] Transfers Sit to Stand: 4: Min assist Stand to Sit: 4: Min assist Stand Pivot Transfers: 4: Min assist;Other (comment) (Pt=75%) Ambulation/Gait Ambulation/Gait Assistance: Not tested (comment) Wheelchair Mobility Wheelchair Mobility: No    Exercises     PT Diagnosis: Difficulty walking;Generalized weakness;Altered mental status  PT Problem List: Decreased strength;Decreased activity tolerance;Decreased balance;Decreased mobility;Decreased coordination;Decreased safety awareness PT Treatment Interventions:     PT Goals Acute Rehab PT Goals PT Goal Formulation: Patient unable to participate in goal setting Time For Goal Achievement: 09/11/11 Potential to Achieve  Goals: Good Pt will go Supine/Side to Sit: with supervision PT Goal: Supine/Side to Sit - Progress: Goal set today Pt will go Sit to Stand: with supervision PT Goal: Sit to Stand - Progress: Goal set today Pt will Transfer Bed to Chair/Chair to Bed: with supervision PT Transfer Goal: Bed to Chair/Chair to Bed - Progress: Goal set today Pt will Ambulate: 51 - 150 feet;with supervision;with least restrictive assistive device PT Goal: Ambulate - Progress: Goal set today  Visit Information  Last PT Received On: 09/04/11 Assistance Needed: +2    Subjective Data  Subjective: I don't want to do anything today Patient Stated Goal: pt did not participate in goal setting   Prior Functioning  Home Living Lives With: Alone Available Help at Discharge: Other (Comment) Type of Home: House Home Access: Stairs to enter [employer] of Steps: 1 Home Layout: One level Bathroom Shower/Tub: Forensic scientist: Standard Home Adaptive Equipment: Grab bars in shower;Quad cane Prior Function Level of Independence: Independent with assistive device(s) Able to Take Stairs?: Yes Driving: No Communication Communication: Other (comment) (confused conversation)    Cognition  Overall Cognitive Status: Impaired Area of Impairment: Attention;Following commands;Safety/judgement;Awareness of deficits;Memory Arousal/Alertness: Awake/alert Orientation Level: Place;Time;Situation Behavior During Session: Anxious Current Attention Level: Sustained Following Commands: Follows one step commands inconsistently Safety/Judgement: Decreased awareness of need for assistance    Extremity/Trunk Assessment Right Lower Extremity Assessment RLE ROM/Strength/Tone: [facility] for tasks assessed;Deficits RLE ROM/Strength/Tone Deficits: Bilaterally weak at >3+/5 and fairly symmetrically weak   Balance Balance Balance Assessed: Yes Static Sitting Balance Static Sitting - Balance Support: No upper  extremity supported;Feet supported Static Sitting - Level of Assistance: 5: Stand by  assistance  End of Session PT - End of Session Activity Tolerance: Patient tolerated treatment well Patient left: in chair;with call bell/phone within reach;Other (comment) Psychiatrist) Nurse Communication: Mobility status  GP     [name], [name] 09/04/2011, 12:54 PM  09/04/2011  [name], PT [phone] (pager)

## 2011-09-04 NOTE — Consult Note (Signed)
[facility] Medicine and Rehabilitation Consult Reason for Consult: symptomatic SDH with speech difficulty and headaches Referring Physician:  Dr. Phoebe Perch   HPI: [name] is a 78 y.o. female with history of fall with B-chronic SDH per evaluation at [facility]  About a month ago.  addmited via [facility] ED on 09/01/11 due to headaches, facial droop and speech difficulties. Follow up CCT with mild increae in SDH and  as patient symptomatic, taken to OR on the same day for left frontal burr hole with placement of SD drain by Dr. Yetta Barre.  Post op continues with confusion. BSS done and patient started on D3, thin liquids. Drain discontinued today. PT/OT evaluations pending.  MD, ST recommending CIR.   Review of Systems  HENT: Positive for hearing loss.   Respiratory: Negative for cough and shortness of breath.   Cardiovascular: Negative for chest pain and palpitations.  Gastrointestinal: Positive for abdominal pain.  Genitourinary:       Difficulty voiding reported.  Musculoskeletal: Positive for myalgias, back pain and joint pain.  Neurological: Positive for tremors and focal weakness.   Past Medical History  Diagnosis Date  . Subdural hematoma   . Hypertension   . Mitral valvular prolapse   . CVA (cerebrovascular accident)    History reviewed. No pertinent past surgical history.  History reviewed. No pertinent family history.  Social History:  Widowed homemaker. Lives alone and independent with cane PTA. Limited mobility due to OA/?RA.  She reports that she has never smoked. She has never used smokeless tobacco. She reports that she does not drink alcohol or use illicit drugs.  Allergies: No Known Allergies  Medications Prior to Admission  Medication Sig Dispense Refill  . levothyroxine (SYNTHROID, LEVOTHROID) 75 MCG tablet Take 75 mcg by mouth daily.      Marland Kitchen triamterene-hydrochlorothiazide (MAXZIDE-25) 37.5-25 MG per tablet Take 1 tablet by mouth 3 (three) times a week.        Home:   Functional History:   Functional Status:  Mobility:          ADL:    Cognition: Cognition Orientation Level: Oriented to person    Blood pressure 137/44, pulse 60, temperature 98.5 F (36.9 C), temperature source Oral, resp. rate 16, height 5\' 3"  (1.6 m), weight 45.9 kg (101 lb 3.1 oz), SpO2 97.00%. Physical Exam  Nursing note and vitals reviewed. Constitutional: She appears well-developed.       Thin elderly female.   HENT:  Head: Normocephalic.       Left frontal crani incision with staples intact.  Eyes: Pupils are equal, round, and reactive to light.  Neck: Normal range of motion.  Cardiovascular: Normal rate and regular rhythm.   Pulmonary/Chest: Effort normal and breath sounds normal.  Abdominal: She exhibits distension. Bowel sounds are decreased. There is tenderness.  Musculoskeletal: She exhibits no edema.  Neurological: She is alert.       Oriented to self only.  Perseverative with decreased memory, decreased recall, poor awareness of deficits without insight.  Left inattention. Left hemiparesis.  Skin: Skin is warm and dry.  Psychiatric: Her speech is delayed. She is slowed. Cognition and memory are impaired.  Motor 4/5 on L, 5/5 on R Sensation intact   No results found for this or any previous visit (from the past 24 hour(s)). No results found.  Assessment/Plan: Diagnosis: Bilateral SDH with reduced balance and cognitive deficits 1. Does the need for close, 24 hr/day medical supervision in concert with the patient's rehab needs make it unreasonable  for this patient to be served in a less intensive setting? Yes 2. Co-Morbidities requiring supervision/potential complications: agitation, HTN 3. Due to bladder management, bowel management, safety, skin/wound care, disease management, medication administration and patient education, does the patient require 24 hr/day rehab nursing? Yes 4. Does the patient require coordinated care of a physician, rehab nurse,  PT (1-2 hrs/day, 5 days/week), OT (1-2 hrs/day, 5 days/week) and SLP (.5-1 hrs/day, 5 days/week) to address physical and functional deficits in the context of the above medical diagnosis(es)? Yes Addressing deficits in the following areas: balance, endurance, locomotion, strength, transferring, bowel/bladder control, bathing, dressing, grooming, toileting and cognition 5. Can the patient actively participate in an intensive therapy program of at least 3 hrs of therapy per day at least 5 days per week? Yes 6. The potential for patient to make measurable gains while on inpatient rehab is good 7. Anticipated functional outcomes upon discharge from inpatient rehab are Sup mobility with PT, supervision ADLs with OT, orientation to person place time and situation, express basic needs with SLP. 8. Estimated rehab length of stay to reach the above functional goals is: 2wk 9. Does the patient have adequate social supports to accommodate these discharge functional goals? Potentially 10. Anticipated D/C setting: Home 11. Anticipated post D/C treatments: HH therapy 12. Overall Rehab/Functional Prognosis: good  RECOMMENDATIONS: This patient's condition is appropriate for continued rehabilitative care in the following setting: if patient has 24 7 supervision at home than CIR if not SNF once medically stable Patient has agreed to participate in recommended program. Potentially Note that insurance prior authorization may be required for reimbursement for recommended care.  Comment:    09/04/2011

## 2011-09-04 NOTE — Progress Notes (Signed)
09/04/11 Received consult for LTAC, spoke with patient and 2 of her daughters about LTAC.Patient unable to discuss d/c needs at present, daughters requested that I speak with their sister Babette Relic, Babette Relic to call me.They stated  that the patient has Medicare, BCBS secondary. Received call from Tammy, explained LTAC, SNF, inpatient rehab, HHC. Tammy requested that LTAC not review patient until inpatient rehab review is completed.She is not interested in LTAC or SNF. She would like to take patient home with 24hr caregiver and HHPT/OT if patient not appropriate for inpatient rehab.Explained that PT recommendation is for SNF at this time.Left list of private duty agencies in bedside table per Tammy's request.Will contact Tammy([phone]) on 09/05/11 to discuss d/c plans further.  Jacquelynn Cree RN, BSN, CCM

## 2011-09-04 NOTE — Progress Notes (Signed)
Patient ID: [name], female   DOB: [birth year], 78 y.o.   MRN: [id number] Physical medicine and rehabilitation consult received. At that time of M.D. Evaluation the patient was very agitated and refused examination. Will followup in the morning to complete the consult.

## 2011-09-05 MED ORDER — ENSURE COMPLETE PO LIQD
237.0000 mL | Freq: Two times a day (BID) | ORAL | Status: DC
  Administered 2011-09-05 – 2011-09-06 (×3): 237 mL via ORAL

## 2011-09-05 MED ORDER — POTASSIUM CHLORIDE IN NACL 20-0.9 MEQ/L-% IV SOLN
INTRAVENOUS | Status: DC
  Administered 2011-09-05: 23:00:00 via INTRAVENOUS
  Filled 2011-09-05 (×2): qty 1000

## 2011-09-05 NOTE — Progress Notes (Signed)
Subjective: Patient reports no c/o  Objective: Vital signs in last 24 hours: Temp:  [97.5 F (36.4 C)-98.4 F (36.9 C)] 98.4 F (36.9 C) (08/13 0700) Pulse Rate:  [56-73] 66  (08/13 0500) Resp:  [13-23] 15  (08/13 0900) BP: (116-180)/(46-109) 139/76 mmHg (08/13 0900) SpO2:  [96 %-98 %] 97 % (08/13 0500) Weight:  [45.8 kg (100 lb 15.5 oz)] 45.8 kg (100 lb 15.5 oz) (08/13 0600)  Intake/Output from previous day: 08/12 0701 - 08/13 0700 In: 1575 [P.O.:300; I.V.:1275] Out: 1506 [Urine:1503; Stool:3] Intake/Output this shift: Total I/O In: 240 [P.O.:240] Out: 300 [Urine:300]  Wound:c/d/i AAOx2+, FC all 4  Lab Results: No results found for this basename: WBC:2,HGB:2,HCT:2,PLT:2 in the last 72 hours BMET  [facility] 09/03/11 0411  NA 144  K 3.6  CL 106  CO2 26  GLUCOSE 104*  BUN 13  CREATININE 0.67  CALCIUM 9.0    Studies/Results: No results found.  Assessment/Plan: Improved - placement  LOS: 4 days     Teiara Baria R, MD 09/05/2011, 10:21 AM

## 2011-09-05 NOTE — Progress Notes (Signed)
Nutrition Follow-up  Intervention:   Ensure Complete po BID, each supplement provides 350 kcal and 13 grams of protein.  Assessment:   Pt confused, sitter at bedside. Family to decide on home vs placement. Drain d/c'ed yesterday.   Diet Order:  Dysphagia 3/Thin liquids Meal completion 50% at dinner last night and Breakfast this am. Pt fed herself. Able to answer some basic questions but confused.   Meds: Scheduled Meds:   . bisacodyl  10 mg Rectal Once  . levothyroxine  75 mcg Oral QAC breakfast  . pantoprazole (PROTONIX) IV  40 mg Intravenous QHS  . senna-docusate  1 tablet Oral BID  . triamterene-hydrochlorothiazide  1 each Oral Daily   Continuous Infusions:   . 0.9 % NaCl with KCl 20 mEq / L 75 mL/hr at 09/05/11 0700   PRN Meds:.acetaminophen, acetaminophen, bisacodyl, docusate sodium, docusate sodium, labetalol, morphine injection, ondansetron (ZOFRAN) IV  Labs:  CMP     Component Value Date/Time   NA 144 09/03/2011 0411   K 3.6 09/03/2011 0411   CL 106 09/03/2011 0411   CO2 26 09/03/2011 0411   GLUCOSE 104* 09/03/2011 0411   BUN 13 09/03/2011 0411   CREATININE 0.67 09/03/2011 0411   CALCIUM 9.0 09/03/2011 0411   PROT 6.7 09/01/2011 0520   ALBUMIN 3.4* 09/01/2011 0520   AST 20 09/01/2011 0520   ALT 9 09/01/2011 0520   ALKPHOS 62 09/01/2011 0520   BILITOT 0.5 09/01/2011 0520   GFRNONAA 79* 09/03/2011 0411   GFRAA >90 09/03/2011 0411  CBG (last 3)  No results found for this basename: GLUCAP:3 in the last 72 hours   Intake/Output Summary (Last 24 hours) at 09/05/11 0838 Last data filed at 09/05/11 0700  Gross per 24 hour  Intake   1335 ml  Output   1506 ml  Net   -171 ml   Last BM: 09/04/11 Weight Status:  100 lbs 8/9 admission weight 100 lbs 8/13  Estimated needs:  1300-1500 kcal, 65-75 grams protein  Nutrition Dx:  Inadequate oral intake now r/t confusion and decreased appetite AEB meal completion 50%, pt report; ongoing.  Goal: Pt to meet >/= 90% of their estimated  nutrition needs; not met.  Monitor:  PO intake, weight, labs   Kendell Bane RD, LDN, CNSC [phone] Pager [phone] After Hours Pager

## 2011-09-05 NOTE — Evaluation (Signed)
Occupational Therapy Evaluation Patient Details Name: [name] MRN: [id number] DOB: 19-Mar-1926 Today's Date: 09/05/2011 Time: 6962-9528 OT Time Calculation (min): 15 min  OT Assessment / Plan / Recommendation Clinical Impression  78 yo female admited with  bilateral chronic SAH.  pt s/p burr hole drainage of Lt SAH. Rt remains.OT to follow acutely    OT Assessment  Patient needs continued OT Services    Follow Up Recommendations  Inpatient Rehab    Barriers to Discharge      Equipment Recommendations  Defer to next venue    Recommendations for Other Services Rehab consult  Frequency  Min 2X/week    Precautions / Restrictions Precautions Precautions: Fall Restrictions Weight Bearing Restrictions: No   Pertinent Vitals/Pain none    ADL  Grooming: Performed;Wash/dry hands;Minimal assistance Where Assessed - Grooming: Supported sitting Toilet Transfer: Chief of Staff: Patient Percentage: 40% Statistician Method: Sit to Barista: Regular height toilet Toileting - Clothing Manipulation and Hygiene: Performed;Maximal assistance Where Assessed - Engineer, mining and Hygiene: Sit to stand from 3-in-1 or toilet Transfers/Ambulation Related to ADLs: pt with narrowed based posterior lean and shuffled gait pattern ADL Comments: Pt cognition deficits. Pt looking at daughter for cues and asking question back to therapist seeking answer. Pt oriented to self only. Pt unable to read clock but pt without glasses at this time. Pt reports "after    OT Diagnosis: Generalized weakness;Cognitive deficits  OT Problem List: Decreased strength;Decreased activity tolerance;Impaired balance (sitting and/or standing);Decreased coordination;Decreased cognition;Decreased safety awareness;Decreased knowledge of use of DME or AE;Decreased knowledge of precautions OT Treatment Interventions: Self-care/ADL training;DME and/or AE  instruction;Therapeutic activities;Cognitive remediation/compensation;Patient/family education;Balance training   OT Goals Acute Rehab OT Goals OT Goal Formulation: With patient/family Time For Goal Achievement: 09/19/11 Potential to Achieve Goals: Good ADL Goals Pt Will Perform Grooming: with min assist;Sitting, chair;Standing at sink ADL Goal: Grooming - Progress: Goal set today Pt Will Perform Upper Body Bathing: Sitting, chair;with supervision;Supported ADL Goal: Product manager - Progress: Goal set today Pt Will Perform Lower Body Bathing: with mod assist;Sit to stand from chair ADL Goal: Lower Body Bathing - Progress: Goal set today Pt Will Perform Upper Body Dressing: with supervision;Supported;Sitting, chair ADL Goal: Upper Body Dressing - Progress: Goal set today Pt Will Perform Lower Body Dressing: with min assist;Sit to stand from chair ADL Goal: Lower Body Dressing - Progress: Goal set today Pt Will Transfer to Toilet: with mod assist;3-in-1;Ambulation ADL Goal: Toilet Transfer - Progress: Goal set today Pt Will Perform Toileting - Clothing Manipulation: with mod assist;Sitting on 3-in-1 or toilet ADL Goal: Toileting - Clothing Manipulation - Progress: Goal set today Pt Will Perform Toileting - Hygiene: with min assist;Sit to stand from 3-in-1/toilet ADL Goal: Toileting - Hygiene - Progress: Goal set today  Visit Information  Last OT Received On: 09/05/11 Assistance Needed: +2 PT/OT Co-Evaluation/Treatment: Yes    Subjective Data  Subjective: " oh no my flying days are over" Patient Stated Goal: to go to my new room   Prior Functioning  Vision/Perception  Home Living Lives With: Alone Available Help at Discharge: Other (Comment) (help at night and daughter considering 24/7) Type of Home: House Home Access: Stairs to enter [employer] of Steps: 1 Home Layout: One level Bathroom Shower/Tub: Tub/shower unit;Curtain Firefighter: Standard Home  Adaptive Equipment: Grab bars in shower;Quad cane Prior Function Level of Independence: Independent with assistive device(s) Able to Take Stairs?: Yes Driving: No Communication Communication: No difficulties Dominant Hand: Right  Cognition  Overall Cognitive Status: Impaired Area of Impairment: Attention;Following commands;Safety/judgement;Awareness of deficits;Memory Arousal/Alertness: Awake/alert Orientation Level: Disoriented to;Place;Time;Situation Behavior During Session: [facility] for tasks performed Current Attention Level: Sustained Memory: Decreased recall of precautions Following Commands: Follows one step commands inconsistently Safety/Judgement: Decreased awareness of need for assistance    Extremity/Trunk Assessment Right Upper Extremity Assessment RUE ROM/Strength/Tone: Within functional levels RUE Coordination: WFL - gross motor Left Upper Extremity Assessment LUE ROM/Strength/Tone: Within functional levels LUE Coordination: WFL - gross motor Trunk Assessment Trunk Assessment: Normal   Mobility Bed Mobility Right Sidelying to Sit: 3: Mod assist;HOB flat Supine to Sit: 4: Min guard Sitting - Scoot to [place] of Bed: 4: Min assist Details for Bed Mobility Assistance:   (A) for scoot with pad  Transfers Transfers: Sit to Stand;Stand to Sit Sit to Stand: 1: +2 Total assist;With upper extremity assist;From bed Sit to Stand: Patient Percentage: 40% Stand to Sit: 1: +2 Total assist;With upper extremity assist;To chair/3-in-1 Stand to Sit: Patient Percentage: 40% Details for Transfer Assistance: Pt required blocking of feet due to feet slidding with posterior leaning with sit <>Stand pt required facilitation for upright posture   Exercise    Balance Static Sitting Balance Static Sitting - Balance Support: Bilateral upper extremity supported;Feet supported Static Sitting - Level of Assistance: 5: Stand by assistance  End of Session OT - End of Session Activity  Tolerance: Patient tolerated treatment well Patient left: in chair;with call bell/phone within reach;with nursing in room Nurse Communication: Mobility status  GO     Lucile Shutters 09/05/2011, 11:53 AM Pager: [phone]

## 2011-09-05 NOTE — Progress Notes (Signed)
Physical Therapy Treatment Patient Details Name: [name] MRN: [id number] DOB: 1926-06-10 Today's Date: 09/05/2011 Time: 4098-1191 PT Time Calculation (min): 15 min  PT Assessment / Plan / Recommendation Comments on Treatment Session  pt still rather confused, but more appropriate communication.  Gait is unsteady and pt is significantly guarded, but motivated.      Follow Up Recommendations  Inpatient Rehab    Barriers to Discharge        Equipment Recommendations  Defer to next venue    Recommendations for Other Services Rehab consult  Frequency Min 3X/week   Plan Discharge plan needs to be updated    Precautions / Restrictions Precautions Precautions: Fall Restrictions Weight Bearing Restrictions: No   Pertinent Vitals/Pain     Mobility  Bed Mobility Bed Mobility: Supine to Sit;Sitting - Scoot to Edge of Bed Right Sidelying to Sit: 3: Mod assist;HOB flat Supine to Sit: 3: Mod assist Sitting - Scoot to Edge of Bed: 3: Mod assist Details for Bed Mobility Assistance: vc's for safety and motivation; truncal assist to come up Transfers Transfers: Sit to Stand;Stand to Sit Sit to Stand: 3: Mod assist Sit to Stand: Patient Percentage: 40% Stand to Sit: 4: Min assist Stand to Sit: Patient Percentage: 40% Details for Transfer Assistance: vc's for hand placement/guidance; stability assist Ambulation/Gait Ambulation/Gait Assistance: 3: Mod assist Ambulation Distance (Feet): 12 Feet Assistive device: 1 person hand held assist Ambulation/Gait Assistance Details: guarded, narrowed BOS with shuffling steps and flexed posture Gait Pattern: Step-through pattern;Decreased step length - right;Decreased step length - left;Decreased stride length;Shuffle;Narrow base of support    Exercises     PT Diagnosis:    PT Problem List:   PT Treatment Interventions:     PT Goals Acute Rehab PT Goals Potential to Achieve Goals: Good PT Goal: Supine/Side to Sit - Progress:  Progressing toward goal PT Goal: Sit to Stand - Progress: Progressing toward goal PT Goal: Ambulate - Progress: Progressing toward goal  Visit Information  Last PT Received On: 09/05/11 Assistance Needed: +2    Subjective Data  Subjective: No my flying days are over...   Cognition  Overall Cognitive Status: Impaired Area of Impairment: Attention;Following commands;Safety/judgement;Awareness of deficits;Memory Arousal/Alertness: Awake/alert Orientation Level: Place;Time;Situation Behavior During Session: Anxious Current Attention Level: Sustained Memory: Decreased recall of precautions Following Commands: Follows one step commands inconsistently Safety/Judgement: Decreased awareness of need for assistance    Balance  Balance Balance Assessed: Yes Static Sitting Balance Static Sitting - Balance Support: No upper extremity supported;Feet supported Static Sitting - Level of Assistance: 5: Stand by assistance  End of Session PT - End of Session Activity Tolerance: Patient tolerated treatment well Patient left: Other (comment) (in w/c to be transported to another unit) Nurse Communication: Mobility status   GP     Anneli Bing, Eliseo Gum 09/05/2011, 11:56 AM  \09/05/2011  [place] Bing, PT [phone] (pager)

## 2011-09-05 NOTE — Progress Notes (Signed)
Inpatient Rehab Admissions: Met with pt who was transferred to 4N[facility] today. Pt confused and unable to state why she is hospitalized. Pt has been requiring sitters for safety. Called pt's daughter, [name], to discuss CIR. She reports that she and her sisters plan to provide 24/7 care for pt post d/c at pt's home. They are very hopeful that pt will come to CIR. Will f/u tomorrow. Call for questions: [phone].

## 2011-09-05 NOTE — Discharge Summary (Signed)
Physician Discharge Summary  Patient ID: [name] MRN: [id number] DOB/AGE: Dec 19, 1926 78 y.o.  Admit date: 09/01/2011 Discharge date: 09/05/2011  Admission Diagnoses:Left chronic subdural hematoma   Discharge Diagnoses: Left chronic subdural hematoma  Active Problems:  * No active hospital problems. *    Discharged Condition: fair  Hospital Course: pt admitted after fall, HA, confusion - had bilat SDH  - underwent surgery listed below - Pt has made slow progress -   Consults: rehabilitation medicine  Significant Diagnostic Studies: none   Treatments: surgery: Left frontal bur hole with placement of subdural drain   Discharge Exam: Blood pressure 139/76, pulse 66, temperature 98.4 F (36.9 C), temperature source Oral, resp. rate 15, height 5\' 3"  (1.6 m), weight 45.8 kg (100 lb 15.5 oz), SpO2 97.00%. Wound:c/d/i AAOx2+ - moves all 4 well ,   Disposition: to rehab or SNF   Medication List  As of 09/05/2011 10:25 AM   TAKE these medications         levothyroxine 75 MCG tablet   Commonly known as: SYNTHROID, LEVOTHROID   Take 75 mcg by mouth daily.      triamterene-hydrochlorothiazide 37.5-25 MG per tablet   Commonly known as: MAXZIDE-25   Take 1 tablet by mouth 3 (three) times a week.             SignedClydene Fake, MD 09/05/2011, 10:25 AM

## 2011-09-06 ENCOUNTER — Inpatient Hospital Stay (HOSPITAL_COMMUNITY)
Admission: RE | Admit: 2011-09-06 | Discharge: 2011-09-15 | DRG: 945 | Disposition: A | Discharge status: Home Health | Payer: Medicare Other | Source: Ambulatory Visit | Attending: Physical Medicine & Rehabilitation | Admitting: Physical Medicine & Rehabilitation

## 2011-09-06 DIAGNOSIS — Z5189 Encounter for other specified aftercare: Secondary | ICD-10-CM

## 2011-09-06 DIAGNOSIS — S065X9A Traumatic subdural hemorrhage with loss of consciousness of unspecified duration, initial encounter: Secondary | ICD-10-CM

## 2011-09-06 DIAGNOSIS — G8190 Hemiplegia, unspecified affecting unspecified side: Secondary | ICD-10-CM | POA: Diagnosis present

## 2011-09-06 DIAGNOSIS — E039 Hypothyroidism, unspecified: Secondary | ICD-10-CM | POA: Diagnosis present

## 2011-09-06 DIAGNOSIS — S065XAA Traumatic subdural hemorrhage with loss of consciousness status unknown, initial encounter: Secondary | ICD-10-CM

## 2011-09-06 DIAGNOSIS — I6200 Nontraumatic subdural hemorrhage, unspecified: Secondary | ICD-10-CM | POA: Diagnosis present

## 2011-09-06 DIAGNOSIS — Z8673 Personal history of transient ischemic attack (TIA), and cerebral infarction without residual deficits: Secondary | ICD-10-CM

## 2011-09-06 DIAGNOSIS — I059 Rheumatic mitral valve disease, unspecified: Secondary | ICD-10-CM | POA: Diagnosis present

## 2011-09-06 DIAGNOSIS — I10 Essential (primary) hypertension: Secondary | ICD-10-CM | POA: Diagnosis present

## 2011-09-06 DIAGNOSIS — R279 Unspecified lack of coordination: Secondary | ICD-10-CM

## 2011-09-06 MED ORDER — SENNOSIDES-DOCUSATE SODIUM 8.6-50 MG PO TABS
1.0000 | ORAL_TABLET | Freq: Two times a day (BID) | ORAL | Status: DC
  Administered 2011-09-06 – 2011-09-15 (×12): 1 via ORAL
  Filled 2011-09-06 (×15): qty 1

## 2011-09-06 MED ORDER — ONDANSETRON HCL 4 MG PO TABS: 4.0000 mg | ORAL_TABLET | Freq: Four times a day (QID) | ORAL | Status: DC | PRN

## 2011-09-06 MED ORDER — ENSURE COMPLETE PO LIQD: 237.0000 mL | Freq: Two times a day (BID) | ORAL | Status: DC

## 2011-09-06 MED ORDER — ACETAMINOPHEN 325 MG PO TABS
325.0000 mg | ORAL_TABLET | ORAL | Status: DC | PRN
  Administered 2011-09-11 – 2011-09-12 (×2): 650 mg via ORAL
  Filled 2011-09-06 (×3): qty 2

## 2011-09-06 MED ORDER — PANTOPRAZOLE SODIUM 40 MG IV SOLR
40.0000 mg | Freq: Every day | INTRAVENOUS | Status: DC
  Administered 2011-09-06 – 2011-09-07 (×2): 40 mg via INTRAVENOUS
  Filled 2011-09-06 (×3): qty 40

## 2011-09-06 MED ORDER — LEVOTHYROXINE SODIUM 75 MCG PO TABS
75.0000 ug | ORAL_TABLET | Freq: Every day | ORAL | Status: DC
  Administered 2011-09-07 – 2011-09-15 (×9): 75 ug via ORAL
  Filled 2011-09-06 (×10): qty 1

## 2011-09-06 MED ORDER — TRIAMTERENE-HCTZ 37.5-25 MG PO TABS
1.0000 | ORAL_TABLET | Freq: Every day | ORAL | Status: DC
  Filled 2011-09-06 (×2): qty 1

## 2011-09-06 MED ORDER — ONDANSETRON HCL 4 MG/2ML IJ SOLN: 4.0000 mg | Freq: Four times a day (QID) | INTRAMUSCULAR | Status: DC | PRN

## 2011-09-06 MED ORDER — SORBITOL 70 % SOLN: 30.0000 mL | Freq: Every day | Status: DC | PRN

## 2011-09-06 NOTE — PMR Pre-admission (Signed)
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 female with history of fall with B-chronic SDH per evaluation at [facility] About a month ago. addmited via [facility] ED on 09/01/11 due to headaches, facial droop and speech difficulties. Follow up CCT with mild increae in SDH and as patient symptomatic, taken to OR on the same day for left frontal burr hole with placement of SD drain by Dr. Yetta Barre. Post op continues with confusion. BSS done and patient started on D3, thin liquids. Drain discontinued 09/05/11..  Total: 1 =NIH   Past Medical History  Past Medical History  Diagnosis Date  . Subdural hematoma   . Hypertension   . Mitral valvular prolapse   . CVA (cerebrovascular accident)     Family History  family history is not on file.  Prior Rehab/Hospitalizations: none   Current Medications  Current facility-administered medications:0.9 % NaCl with KCl 20 mEq/ L  infusion, , Intravenous, Continuous, Clydene Fake, MD, Last Rate: 50 mL/hr at 09/05/11 2253;  acetaminophen (TYLENOL) suppository 650 mg, 650 mg, Rectal, Q4H PRN, Tia Alert, MD;  acetaminophen (TYLENOL) tablet 650  mg, 650 mg, Oral, Q4H PRN, Tia Alert, MD;  bisacodyl (DULCOLAX) suppository 10 mg, 10 mg, Rectal, Daily PRN, Clydene Fake, MD docusate sodium (COLACE) capsule 100 mg, 100 mg, Oral, Daily PRN, Clydene Fake, MD;  docusate sodium [facility]) enema 283 mg, 1 enema, Rectal, Daily PRN, Clydene Fake, MD;  feeding supplement (ENSURE COMPLETE) liquid 237 mL, 237 mL, Oral, BID BM, Heather Cornelison Pitts, RD, 237 mL at 09/05/11 1613;  labetalol (NORMODYNE,TRANDATE) injection 10-40 mg, 10-40 mg, Intravenous, Q10 min PRN, Tia Alert, MD, 10 mg at 09/04/11 4132 levothyroxine (SYNTHROID, LEVOTHROID) tablet 75 mcg, 75 mcg, Oral, QAC breakfast, Tia Alert, MD, 75 mcg at 09/06/11 1056;  ondansetron (ZOFRAN) injection 4 mg, 4 mg, Intravenous, Q6H PRN, Tia Alert, MD;  pantoprazole (PROTONIX) injection 40 mg, 40 mg, Intravenous, QHS, Tia Alert, MD, 40 mg at 09/05/11 2253;  senna-docusate (Senokot-S) tablet 1 tablet, 1 tablet, Oral, BID, Tia Alert, MD, 1 tablet at 09/06/11 1056 triamterene-hydrochlorothiazide (MAXZIDE-25) 37.5-25 MG per tablet 1 each, 1 each, Oral, Daily, Tia Alert, MD, 1 each at 09/06/11 1056;  DISCONTD: 0.9 % NaCl with KCl 20 mEq/ L  infusion, , Intravenous, Continuous, Tia Alert, MD, Last Rate: 75 mL/hr at 09/05/11 0700;  DISCONTD: morphine 2 MG/ML injection 1-3 mg, 1-3 mg, Intravenous, Q1H PRN, Tia Alert, MD, 2 mg  at 09/03/11 0125  Patients Current Diet: Dysphagia  Precautions / Restrictions Precautions Precautions: Fall Restrictions Weight Bearing Restrictions: No   Prior Activity Level Household: Outing once every 2 weeks Journalist, newspaper / Equipment Home Assistive Devices/Equipment: Environmental consultant (specify type) Home Adaptive Equipment: Grab bars in shower;Quad cane  Prior Functional Level Prior Function Level of Independence: Needs assistance (Pt was independent during the day, with hired CG at night.) Able to Take Stairs?: Yes Driving: No  Current  Functional Level Cognition  Arousal/Alertness: Awake/alert Overall Cognitive Status: Impaired Overall Cognitive Status: Impaired Current Attention Level: Sustained Memory: Decreased recall of precautions Orientation Level: Oriented to person;Disoriented to place;Disoriented to time;Disoriented to situation Following Commands: Follows one step commands inconsistently Safety/Judgement: Decreased awareness of need for assistance Attention: Focused;Sustained;Selective Focused Attention: Appears intact Sustained Attention: Impaired Sustained Attention Impairment: Verbal complex;Functional complex Selective Attention: Impaired Selective Attention Impairment: Verbal complex;Functional complex Memory: Impaired Memory Impairment: Decreased recall of new information;Decreased short term memory;Storage deficit;Retrieval deficit Decreased Short Term Memory: Verbal basic;Functional basic Awareness: Impaired Awareness Impairment: Intellectual impairment;Emergent impairment;Anticipatory impairment Problem Solving: Impaired Problem Solving Impairment: Verbal basic;Functional basic Safety/Judgment: Impaired    Extremity Assessment (includes Sensation/Coordination)  RUE ROM/Strength/Tone: Within functional levels RUE Coordination: WFL - gross motor  RLE ROM/Strength/Tone: WFL for tasks assessed;Deficits RLE ROM/Strength/Tone Deficits: Bilaterally weak at >3+/5 and fairly symmetrically weak    ADLs  Grooming: Performed;Wash/dry hands;Minimal assistance Where Assessed - Grooming: Supported sitting Toilet Transfer: Chief of Staff: Patient Percentage: 40% Statistician Method: Sit to Barista: Regular height toilet Toileting - Clothing Manipulation and Hygiene: Performed;Maximal assistance Where Assessed - Engineer, mining and Hygiene: Sit to stand from 3-in-1 or toilet Transfers/Ambulation Related to ADLs: pt with narrowed  based posterior lean and shuffled gait pattern ADL Comments: Pt cognition deficits. Pt looking at daughter for cues and asking question back to therapist seeking answer. Pt oriented to self only. Pt unable to read clock but pt without glasses at this time. Pt reports "after    Mobility  Bed Mobility: Supine to Sit;Sitting - Scoot to Edge of Bed Right Sidelying to Sit: 3: Mod assist;HOB flat Supine to Sit: 3: Mod assist Sitting - Scoot to Edge of Bed: 3: Mod assist    Transfers  Transfers: Sit to Stand;Stand to Sit Sit to Stand: 3: Mod assist Sit to Stand: Patient Percentage: 40% Stand to Sit: 4: Min assist Stand to Sit: Patient Percentage: 40% Stand Pivot Transfers: 4: Min assist;Other (comment) (Pt=75%)    Ambulation / Gait / Stairs / Psychologist, prison and probation services  Ambulation/Gait Ambulation/Gait Assistance: 3: Mod assist Ambulation Distance (Feet): 12 Feet Assistive device: 1 person hand held assist Ambulation/Gait Assistance Details: guarded, narrowed BOS with shuffling steps and flexed posture Gait Pattern: Step-through pattern;Decreased step length - right;Decreased step length - left;Decreased stride length;Shuffle;Narrow base of support Wheelchair Mobility Wheelchair Mobility: No    Posture / Balance Static Sitting Balance Static Sitting - Balance Support: No upper extremity supported;Feet supported Static Sitting - Level of Assistance: 5: Stand by assistance     Previous Home Environment Living Arrangements: Non-relatives/Friends Lives With: Alone Available Help at Discharge: help at night and daughter considering 24/7 Type of Home: House Home Layout: One level Home Access: Stairs to enter Secretary/administrator of Steps: 1 Bathroom Shower/Tub: Forensic scientist: Standard Home Care Services: No  Discharge Living Setting Plans for Discharge Living Setting: Patient's home Type of Home at Discharge: House Discharge Home Layout: One level Discharge Home  Access:  Stairs to enter Entrance Stairs-Rails: None Entrance Stairs-Number of Steps: 1 Do you have any problems obtaining your medications?: No  Social/Family/Support Systems Patient Roles: Parent Contact Information: Pricilla Riffle Anticipated Caregiver: Daughters (Plan to hire caregivers) Anticipated Caregiver's Contact Information: Pricilla Riffle: [phone], Tammy: [phone] Ability/Limitations of Caregiver: none Caregiver Availability: Daughters can provide some assist and will hire caregivers Discharge Plan Discussed with Primary Caregiver: Yes Is Caregiver In Agreement with Plan?: Yes Does Caregiver/Family have Issues with Lodging/Transportation while Pt is in Rehab?: No  Goals/Additional Needs Patient/Family Goal for Rehab: Supervision PT&OT& SLP Expected length of stay: 2 weeks Cultural Considerations: none Dietary Needs: Dys 3 Pt/Family Agrees to Admission and willing to participate: Yes Program Orientation Provided & Reviewed with Pt/Caregiver Including Roles  & Responsibilities: Yes  Patient Condition: This patient's condition remains as documented in the Consult dated 09/05/11, in which the Rehabilitation Physician determined and documented that the patient's condition is appropriate for intensive rehabilitative care in an inpatient rehabilitation facility.  Preadmission Screen Completed By:  Meryl Dare, 09/06/2011 12:00 PM ______________________________________________________________________   Discussed status with Dr. Wynn Banker on 09/06/11 at 12:12 PM and received telephone approval for admission today.  Admission Coordinator:  Meryl Dare, [id number] PM Date 09/06/11

## 2011-09-06 NOTE — Care Management Note (Signed)
    Page 1 of 1   09/06/2011     2:53:39 PM   CARE MANAGEMENT NOTE 09/06/2011  Patient:  [name], [name]   Account Number:  [id number]  Date Initiated:  09/01/2011  Documentation initiated by:  Carlyle Lipa  Subjective/Objective Assessment:   bilat SDH from falls at home with new neuro deficits     Action/Plan:   home with family vs SNF at d/c  PT eval-recommending SNF  inpatient rehab eval-   Anticipated DC Date:  09/07/2011   Anticipated DC Plan:  SKILLED NURSING FACILITY      DC Planning Services  CM consult      Choice offered to / List presented to:             Status of service:  Completed, signed off Medicare Important Message given?   (If response is "NO", the following Medicare IM given date fields will be blank) Date Medicare IM given:   Date Additional Medicare IM given:    Discharge Disposition:  IP REHAB FACILITY  Per UR Regulation:  Reviewed for med. necessity/level of care/duration of stay  If discussed at Long Length of Stay Meetings, dates discussed:    Comments:  09/06/11 Onnie Boer, RN, BSN 1453 PT SHOULD DC TODAY TO CIR.  09/04/11 Received consult for LTAC, spoke with patient and 2 of her daughters about LTAC.Patient unable to discuss d/c needs at present, daughters requested that I speak with their sister Babette Relic, Babette Relic to call me.They stated  that the patient has Medicare, BCBS secondary. Received call from Tammy, explained LTAC, SNF, inpatient rehab, HHC. Tammy requested that LTAC not review patient until inpatient rehab review is completed.She is not interested in LTAC or SNF. She would like to take patient home with 24hr caregiver and HHPT/OT if patient not appropriate for inpatient rehab.Explained that PT recommendation is for SNF at this time.Left list of private duty agencies in bedside table per Tammy's request.Will contact Tammy([phone]) on 09/05/11 to discuss d/c plans further.  Jacquelynn Cree RN, BSN, CCM

## 2011-09-06 NOTE — H&P (Signed)
Physical Medicine and Rehabilitation Admission H&P    No chief complaint on file. : HPI: [name] is a 78 y.o. right-handed female with history of fall with bilateral small-chronic SDH per evaluation at [facility] about a month ago. Admitted via [facility] ED on 09/01/11 due to headaches, facial droop and speech difficulties. Follow up CCT with mild increase in SDH and as patient symptomatic, taken to OR on the same day for left frontal burr hole with placement of SD drain by Dr. Yetta Barre. Post op continues with confusion and a sitter was provided for her safety. Bedside swallow evaluation done and patient started on D3, thin liquids. Drain discontinued 09/04/2011. Physical and occupational therapy ongoing. Patient was felt to be a good candidate after evaluation by physical medicine rehabilitation and admitted for comprehensive rehabilitation program.  Review of Systems  HENT: Positive for hearing loss.  Respiratory: Negative for cough and shortness of breath.  Cardiovascular: Negative for chest pain and palpitations.  Gastrointestinal: Positive for abdominal pain.  Genitourinary:  Difficulty voiding reported.  Musculoskeletal: Positive for myalgias, back pain and joint pain.  Neurological: Positive for tremors and focal weakness   Past Medical History  Diagnosis Date  . Subdural hematoma   . Hypertension   . Mitral valvular prolapse   . CVA (cerebrovascular accident)    Past Surgical History  Procedure Date  . Ines Bloomer hole 09/01/2011    Procedure: Ezekiel Ina;  Surgeon: Tia Alert, MD;  Location: [facility];  Service: Neurosurgery;  Laterality: Left;  Left Burr Holes    No family history on file. Social History:  reports that she has never smoked. She has never used smokeless tobacco. She reports that she does not drink alcohol or use illicit drugs. Allergies: No Known Allergies Medications Prior to Admission  Medication Sig Dispense Refill  . levothyroxine (SYNTHROID, LEVOTHROID) 75  MCG tablet Take 75 mcg by mouth daily.      Marland Kitchen triamterene-hydrochlorothiazide (MAXZIDE-25) 37.5-25 MG per tablet Take 1 tablet by mouth 3 (three) times a week.        Home:     Functional History:    Functional Status:  Mobility:          ADL:    Cognition: Cognition Orientation Level: Oriented to person;Disoriented to place;Disoriented to time;Disoriented to situation     Blood pressure 147/73, pulse 65, temperature 97.5 F (36.4 C), temperature source Oral, resp. rate 17, weight 47.3 kg (104 lb 4.4 oz), SpO2 97.00%. Physical Exam  Nursing note and vitals reviewed.  Constitutional: She appears well-developed.  Thin elderly female.  HENT:  Head: Normocephalic.  Left frontal crani incision with staples intact.  Eyes: Pupils are equal, round, and reactive to light.  Neck: Normal range of motion.  Cardiovascular: Normal rate and regular rhythm.  Pulmonary/Chest: Effort normal and breath sounds normal.  Abdominal: She exhibits distension. Bowel sounds are decreased. There is tenderness.  Musculoskeletal: She exhibits no edema.  Neurological: She is alert.  Oriented to self age and date of birth. She was able to name her children. Perseverative with decreased memory, decreased recall, poor awareness of deficits without insight and impulsive. Left inattention. Left hemiparesis. She is able to name the president and year but not day ,or month Skin: Skin is warm and dry.  Psychiatric: Her speech is delayed. She is slowed. Cognition and memory are impaired.  Motor 4/5 on L, 5/5 on R  Sensation intact   No results found for this or any previous visit (from  the past 48 hour(s)). No results found.  Post Admission Physician Evaluation: 1. Functional deficits secondary  to L subdural hematoma. 2. Patient is admitted to receive collaborative, interdisciplinary care between the physiatrist, rehab nursing staff, and therapy team. 3. Patient's level of medical complexity and  substantial therapy needs in context of that medical necessity cannot be provided at a lesser intensity of care such as a SNF. 4. Patient has experienced substantial functional loss from his/her baseline which was documented above under the "Functional History" and "Functional Status" headings.  Judging by the patient's diagnosis, physical exam, and functional history, the patient has potential for functional progress which will result in measurable gains while on inpatient rehab.  These gains will be of substantial and practical use upon discharge  in facilitating mobility and self-care at the household level. 5. Physiatrist will provide 24 hour management of medical needs as well as oversight of the therapy plan/treatment and provide guidance as appropriate regarding the interaction of the two. 6. 24 hour rehab nursing will assist with bladder management, bowel management, safety, skin/wound care, disease management, medication administration and patient education  and help integrate therapy concepts, techniques,education, etc. 7. PT will assess and treat for:  Pre gail, gait, safety, endurance, equipment.  Goals are: Supervision to mod I mobility. 8. OT will assess and treat for: ADL,Cog/perceptual skills, safety,endurance.   Goals are: sup ADLs. 9. SLP will assess and treat for: memory, attention, expressive language, safety.  Goals are: Sup medication management, 100% orientation, direct basic household management. 10. Case Management and Social Worker will assess and treat for psychological issues and discharge planning. 11. Team conference will be held weekly to assess progress toward goals and to determine barriers to discharge. 12. Patient will receive at least 3 hours of therapy per day at least 5 days per week. 13. ELOS: 10days      Prognosis:  good   Medical Problem List and Plan: 1. Bilateral chronic subdural hematoma. Status post left frontal burr hole 09/01/2011 2. DVT  Prophylaxis/Anticoagulation: SCDs. Monitor for any signs of DVT 3. Mood. Monitor safety and check sleep chart. Patient will need a bed alarm for safety. Encouraged family to stay at night 4. Pain Management: Currently only on Tylenol for pain. Will monitor with increased activity 5. Neuropsych: This patient is not capable of making decisions on his/her own behalf. 6. Hypertension. Maxzide daily. Monitor for any signs of orthostasis 7. Hypothyroidism. Synthroid  09/06/2011, 8:12 PM

## 2011-09-06 NOTE — Plan of Care (Signed)
Overall Plan of Care [facility]) Patient Details Name: [name] MRN: [id number] DOB: Feb 18, 1926  Diagnosis:  Rehabilitation for subdural hematoma  Primary Diagnosis:    SDH (subdural hematoma) Co-morbidities: decreased safety awareness, and intermittent agitation, wound care following left frontal burr hole  Functional Problem List  Patient demonstrates impairments in the following areas: Balance, Bladder, Cognition, Medication Management, Motor and Pain  Basic ADL's: grooming, bathing, dressing and toileting Advanced ADL's: light housekeeping  Transfers:  bed mobility, bed to chair, toilet, tub/shower, car and floor Locomotion:  ambulation, wheelchair mobility and stairs  Additional Impairments:  Communication  comprehension and expression and Social Cognition   problem solving, memory, attention and awareness  Anticipated Outcomes Item Anticipated Outcome  Eating/Swallowing  Superivsion  Basic self-care  Supervision  Tolieting  Supervision  Bowel/Bladder  Pt is continent of bowel, occasional incontinence  Transfers   Supervision  Locomotion   Supervision  Communication  Min A  Cognition  Min A  Pain  Pt will rate pain less than 3 on scale of 0-10  Safety/Judgment  Pt will call for assist and remain free of falls during admission with min assist  Other  Skin will remain intact during admission   Therapy Plan: PT Frequency: 2-3 X/day, 60-90 minutes SLP Frequency: 1-2 X/day, 30-60 minutes  OT Frequency:  1-2 X/day, 60-90 minutes  Team Interventions: Item RN PT OT SLP SW TR Other  Self Care/Advanced ADL Retraining   x      Neuromuscular Re-Education  x x      Therapeutic Activities  x x x  x   UE/LE Strength Training/ROM  x x   x   UE/LE Coordination Activities  x x   x   Visual/Perceptual Remediation/Compensation         DME/Adaptive Equipment Instruction  x x   x   Therapeutic Exercise  x x   x   Balance/Vestibular Training  x x   x   Patient/Family  Education  x x x  x   Cognitive Remediation/Compensation  x x x  x   Functional Mobility Training  x x   x   Ambulation/Gait Training  x       Museum/gallery curator  x       Wheelchair Propulsion/Positioning  x       Functional Tourist information centre manager Reintegration      x   Dysphagia/Aspiration Landscape architect Facilitation    x     Bladder Management x        Bowel Management         Disease Management/Prevention         Pain Management x x x      Medication Management x        Skin Care/Wound Management  x       Splinting/Orthotics         Discharge Planning  x x x x x   Psychosocial Support x x x x x x                      Team Discharge Planning: Destination:  Home Projected Follow-up:  PT, OT, SLP and Home Health Projected Equipment Needs:  Tub Bench vs. Shower chair, Wheelchair, and potentially RW. Patient/family involved in discharge planning:  Yes  MD ELOS: 10-14 days Medical Rehab Prognosis:  Excellent Assessment: 78 year old female with subdural  hematoma which required evacuation in the left frontal area. Now requiring CIR level PT OT SLP as well as 24 7 rehabilitation RN and M.D.

## 2011-09-06 NOTE — Progress Notes (Signed)
Report called to Rehab RN. Patient transferred to CIR in no signs of acute distress. Education and D/C instructions given to patient and daughter at bedside.

## 2011-09-06 NOTE — Progress Notes (Signed)
Occupational Therapy Treatment Patient Details Name: Sherl Yzaguirre MRN: [id number] DOB: 02-Oct-1926 Today's Date: 09/06/2011 Time: 1105-1130 OT Time Calculation (min): 25 min  OT Assessment / Plan / Recommendation Comments on Treatment Session Pt progressing with therapy. Continue to recommend CIR for d/c plan    Follow Up Recommendations  Inpatient Rehab    Barriers to Discharge       Equipment Recommendations  Defer to next venue    Recommendations for Other Services Rehab consult  Frequency     Plan Discharge plan remains appropriate    Precautions / Restrictions Precautions Precautions: Fall Restrictions Weight Bearing Restrictions: No   Pertinent Vitals/Pain Pt denies any pain during session    ADL  Grooming: Performed;Teeth care;Wash/dry hands;Minimal assistance Where Assessed - Grooming: Unsupported standing Lower Body Dressing: Performed;Moderate assistance (don socks) Where Assessed - Lower Body Dressing: Unsupported sitting Toilet Transfer: Moderate assistance;Simulated Toilet Transfer Method: Sit to Barista:  (from bed) Toileting - Architect and Hygiene: Simulated;Minimal assistance Where Assessed - Engineer, mining and Hygiene: Standing Equipment Used: Gait belt Transfers/Ambulation Related to ADLs: Pt Min to Mod A with HHA ambulation to bathroom and out into hallway ADL Comments: Pt with with right lateral lean during ambulation- able to correct with VC.     OT Diagnosis:    OT Problem List:   OT Treatment Interventions:     OT Goals ADL Goals ADL Goal: Grooming - Progress: Progressing toward goals ADL Goal: Lower Body Dressing - Progress: Progressing toward goals ADL Goal: Toilet Transfer - Progress: Progressing toward goals  Visit Information  Last OT Received On: 09/06/11 Assistance Needed: +2    Subjective Data      Prior Functioning       Cognition  Arousal/Alertness:  Awake/alert Orientation Level: Disoriented to;Place;Situation;Time Behavior During Session: [facility] for tasks performed Current Attention Level: Sustained Following Commands: Follows one step commands inconsistently Safety/Judgement: Decreased awareness of need for assistance Cognition - Other Comments: Pt perseverating on socks and how she cannot believe she bought "such ugly things" Pt requires direct commands to perform tasks (e.g. put this sock on)    Mobility Bed Mobility Bed Mobility: Not assessed Transfers Sit to Stand: 3: Mod assist;From bed Stand to Sit: 4: Min assist;With armrests;To chair/3-in-1 Details for Transfer Assistance: VC for hand placement and for sequencing   Exercises    Balance Static Sitting Balance Static Sitting - Balance Support: No upper extremity supported;Feet supported Static Sitting - Level of Assistance: 5: Stand by assistance Static Sitting - Comment/# of Minutes: ~64min at EOB  End of Session OT - End of Session Equipment Utilized During Treatment: Gait belt Activity Tolerance: Patient tolerated treatment well Patient left: in chair;with call bell/phone within reach;with family/visitor present Psychiatrist in room) Nurse Communication: Mobility status  GO     Tyannah Sane 09/06/2011, 1:48 PM

## 2011-09-06 NOTE — Progress Notes (Signed)
Pt transferred from 4North to 4004, pt alert to self, daughters present at bedside, pt and family oriented to room , rehab schedule and safety plan, see CHL for full assessment, pt resting in bed with call bell in reach and bed alarm on

## 2011-09-06 NOTE — Progress Notes (Signed)
CIR Admissions: Noted that Dr Phoebe Perch wrote d/c summary. Called Dr Hirsch's office and left message requesting an order for pt to discharge for transfer to CIR today. Met with pt and her daughter, [name] this AM and discussed plan for transferring pt to inpatient rehab today. Pt's daughter very pleased and reports that pt is doing much better today. Discussed CIR routine, what to expect and handouts provided. Informed pt's nurse and CM of transfer today.

## 2011-09-06 NOTE — Progress Notes (Signed)
Patient has been awake most of the night; getting some sleep around 0400.  Patient can become easily agitated and claw and grab staff.  Spoke in low tones and gave her reassurance as we changed linens as needed and washed her up.  Having a difficult time believing she is not in her home.  Reorientation often not very successful.  Continue to reorient patient.

## 2011-09-06 NOTE — Progress Notes (Signed)
sleeping - was up most of night -   Temp:  [97.5 F (36.4 C)-98.1 F (36.7 C)] 98.1 F (36.7 C) (08/13 2200) Pulse Rate:  [63-73] 64  (08/13 2200) Resp:  [15-18] 18  (08/13 2200) BP: (131-157)/(46-119) 157/75 mmHg (08/13 2200) SpO2:  [97 %-98 %] 98 % (08/13 2200)  Incision CDI Arouses - confused  Plan: placement

## 2011-09-07 ENCOUNTER — Inpatient Hospital Stay (HOSPITAL_COMMUNITY): Payer: Medicare Other | Admitting: Speech Pathology

## 2011-09-07 ENCOUNTER — Inpatient Hospital Stay (HOSPITAL_COMMUNITY): Payer: Medicare Other | Admitting: *Deleted

## 2011-09-07 ENCOUNTER — Encounter (HOSPITAL_COMMUNITY): Payer: Self-pay | Admitting: *Deleted

## 2011-09-07 ENCOUNTER — Inpatient Hospital Stay (HOSPITAL_COMMUNITY): Payer: Medicare Other | Admitting: Occupational Therapy

## 2011-09-07 ENCOUNTER — Inpatient Hospital Stay (HOSPITAL_COMMUNITY): Payer: Medicare Other | Admitting: Physical Therapy

## 2011-09-07 DIAGNOSIS — S065X9A Traumatic subdural hemorrhage with loss of consciousness of unspecified duration, initial encounter: Secondary | ICD-10-CM

## 2011-09-07 DIAGNOSIS — Z5189 Encounter for other specified aftercare: Secondary | ICD-10-CM

## 2011-09-07 DIAGNOSIS — I6200 Nontraumatic subdural hemorrhage, unspecified: Secondary | ICD-10-CM

## 2011-09-07 DIAGNOSIS — R279 Unspecified lack of coordination: Secondary | ICD-10-CM

## 2011-09-07 LAB — CBC WITH DIFFERENTIAL/PLATELET
Eosinophils Absolute: 0.2 10*3/uL (ref 0.0–0.7)
Eosinophils Relative: 5 % (ref 0–5)
Lymphs Abs: 1.8 10*3/uL (ref 0.7–4.0)
MCH: 30.9 pg (ref 26.0–34.0)
MCV: 91 fL (ref 78.0–100.0)
Monocytes Relative: 10 % (ref 3–12)
Platelets: 172 10*3/uL (ref 150–400)
RBC: 3.88 MIL/uL (ref 3.87–5.11)

## 2011-09-07 LAB — COMPREHENSIVE METABOLIC PANEL
BUN: 14 mg/dL (ref 6–23)
Calcium: 9 mg/dL (ref 8.4–10.5)
GFR calc Af Amer: 87 mL/min — ABNORMAL LOW (ref >90)
Glucose, Bld: 91 mg/dL (ref 70–99)
Sodium: 142 mEq/L (ref 135–145)
Total Protein: 6.5 g/dL (ref 6.0–8.3)

## 2011-09-07 MED ORDER — GLUCERNA SHAKE PO LIQD
237.0000 mL | Freq: Two times a day (BID) | ORAL | Status: DC
  Administered 2011-09-08 – 2011-09-14 (×12): 237 mL via ORAL
  Filled 2011-09-07: qty 237

## 2011-09-07 MED ORDER — TRIAMTERENE-HCTZ 37.5-25 MG PO TABS
1.0000 | ORAL_TABLET | ORAL | Status: DC
  Administered 2011-09-08 – 2011-09-15 (×4): 1 via ORAL
  Filled 2011-09-07 (×4): qty 1

## 2011-09-07 MED ORDER — TRIAMTERENE-HCTZ 37.5-25 MG PO TABS: 1.0000 | ORAL_TABLET | Freq: Every day | ORAL | Status: DC

## 2011-09-07 MED ORDER — POTASSIUM CHLORIDE CRYS ER 20 MEQ PO TBCR
20.0000 meq | EXTENDED_RELEASE_TABLET | Freq: Three times a day (TID) | ORAL | Status: AC
  Administered 2011-09-07 (×3): 20 meq via ORAL
  Filled 2011-09-07 (×3): qty 1

## 2011-09-07 NOTE — Progress Notes (Signed)
Patient information reviewed and entered into [employer] system by Nael Petrosyan, RN, CRRN, PPS Coordinator.  Information including medical coding and functional independence measure will be reviewed and updated through discharge.     Per nursing patient was given "Data Collection Information Summary for Patients in Inpatient Rehabilitation Facilities with attached "Privacy Act Statement-Health Care Records" upon admission.   

## 2011-09-07 NOTE — Progress Notes (Signed)
Patient ID: [name], female   DOB: November 04, 1926, 78 y.o.   MRN: [id number]   Subjective/Complaints: Slept well, some R shoulder pain  Objective: Vital Signs: Blood pressure 161/62, pulse 64, temperature 98 F (36.7 C), temperature source Oral, resp. rate 18, weight 47.3 kg (104 lb 4.4 oz), SpO2 98.00%. No results found. No results found for this or any previous visit (from the past 72 hour(s)).   HEENT: normal Cardio: RRR Resp: CTA B/L GI: BS positive Extremity:  Pulses positive Skin:   Intact Neuro: Confused, Cranial Nerve II-XII normal, Normal Sensory and Apraxic Musc/Skel:  Extremity tender R shoulder AC joint pain   Assessment/Plan: 1. Functional deficits secondary to L frontal SDH with cognitive , behaovior deficits and apraxia which require 3+ hours per day of interdisciplinary therapy in a comprehensive inpatient rehab setting. Physiatrist is providing close team supervision and 24 hour management of active medical problems listed below. Physiatrist and rehab team continue to assess barriers to discharge/monitor patient progress toward functional and medical goals. FIM:                   Comprehension Comprehension Mode: Auditory  Expression Expression Mode: Verbal Expression: 2-Expresses basic 25 - 49% of the time/requires cueing 50 - 75% of the time. Uses single words/gestures.  Social Interaction Social Interaction: 2-Interacts appropriately 25 - 49% of time - Needs frequent redirection.  Problem Solving Problem Solving: 2-Solves basic 25 - 49% of the time - needs direction more than half the time to initiate, plan or complete simple activities  Memory Memory: 2-Recognizes or recalls 25 - 49% of the time/requires cueing 51 - 75% of the time  Medical Problem List and Plan:  1. Bilateral chronic subdural hematoma. Status post left frontal burr hole 09/01/2011  2. DVT Prophylaxis/Anticoagulation: SCDs. Monitor for any signs of DVT  3. Mood. Monitor  safety and check sleep chart. Patient will need a bed alarm for safety. Encouraged family to stay at night  4. Pain Management: Currently only on Tylenol for pain. Will monitor with increased activity  5. Neuropsych: This patient is not capable of making decisions on his/her own behalf.  6. Hypertension. Maxzide daily. Monitor for any signs of orthostasis  7. Hypothyroidism. Synthroid 8.  R AC joint separation May 2013 with residual pain no precautions  LOS (Days) 1 A FACE TO FACE EVALUATION WAS PERFORMED  KIRSTEINS,ANDREW E 09/07/2011, 7:10 AM

## 2011-09-07 NOTE — Evaluation (Signed)
Speech Language Pathology Assessment and Plan  Patient Details  Name: [name] MRN: [id number] Date of Birth: 10/24/1926  SLP Diagnosis: Cognitive Impairments  Rehab Potential: Excellent ELOS: 10-14 days   Today's Date: 09/07/2011 Time: 1030-1130 Time Calculation (min): 60 min  Skilled Therapeutic Intervention: Administered cognitive-linguistic evaluation. Please see below for details.   Problem List:  Patient Active Problem List  Diagnosis  . PREMATURE VENTRICULAR CONTRACTIONS  . PALPITATIONS  . SDH (subdural hematoma)   Past Medical History:  Past Medical History  Diagnosis Date  . Subdural hematoma   . Hypertension   . Mitral valvular prolapse   . CVA (cerebrovascular accident)    Past Surgical History:  Past Surgical History  Procedure Date  . Ines Bloomer hole 09/01/2011    Procedure: Ezekiel Ina;  Surgeon: Tia Alert, MD;  Location: [facility];  Service: Neurosurgery;  Laterality: Left;  Left Burr Holes     Assessment / Plan / Recommendation Clinical Impression  Pt is a 78 y.o. right-handed female with history of fall with bilateral small-chronic SDH per evaluation at [facility] about a month ago. Admitted via [facility] ED on 09/01/11 due to headaches, facial droop and speech difficulties. Follow up CCT with mild increase in SDH and as patient symptomatic, taken to OR on the same day for left frontal burr hole with placement of SD drain by Dr. Yetta Barre. Post op continues with confusion and a sitter was provided for her safety. Bedside swallow evaluation done and patient started on Dys. 3 textures with thin liquids. Drain discontinued 09/04/2011. Patient transferred to CIR on 09/06/2011. Pt presents with Moderate-severe cognitive impairments characterized by impaired working memory, sustained attention, intellectual awareness, functional problem solving and delayed processing.  PTA, pt's daughter reports she was independent for medication management but had assistance with cooking,  cleaning and money management. Pt also had a caregiver that stayed nights. Patient will benefit from skilled SLP intervention to maximize cognitive function and overall independence to decrease caregiver burden for planned discharge home with 24 hour supervision/assist. Anticipate patient will benefit from follow up [facility] at discharge.    SLP Assessment  Patient will need skilled Speech Lanaguage Pathology Services during CIR admission    Recommendations  Follow up Recommendations: Home Health SLP;24 hour supervision/assistance Equipment Recommended: None recommended by SLP    SLP Frequency 1-2 X/day, 30-60 minutes   SLP Treatment/Interventions Cognitive remediation/compensation;Cueing hierarchy;Functional tasks;Internal/external aids;Therapeutic Activities;Patient/family education;Environmental controls    Pain Pain Assessment Pain Assessment: 0-10 Pain Score:   2 Pain Type: Chronic pain Pain Location: Shoulder Pain Orientation: Right Pain Descriptors: Sore Pain Onset: With Activity Patients Stated Pain Goal: 2 Pain Intervention(s): Repositioned (denied need for pain med) Multiple Pain Sites: No  Short Term Goals: Week 1: SLP Short Term Goal 1 (Week 1): Pt will utilize call bell to express wants/needs witth Mod A question and semantic cues. SLP Short Term Goal 2 (Week 1): Pt will sustain attention to task for ~10 minutes with Min A cues for redirection SLP Short Term Goal 3 (Week 1): Pt will demonstrate functional problem solving for basic and familiar tasks with Mod A verbald and question cues.  SLP Short Term Goal 4 (Week 1): Pt will utilize external memory aids to recall daily information (schedule, calendar, etc) with Mod A verbal and visual cues. SLP Short Term Goal 5 (Week 1): Pt will orient to person, place, situation and time with Mod A semantic and visual cues.   See FIM for current functional status Refer to  Care Plan for Long Term Goals  Recommendations for other services:  None  Discharge Criteria: Patient will be discharged from SLP if patient refuses treatment 3 consecutive times without medical reason, if treatment goals not met, if there is a change in medical status, if patient makes no progress towards goals or if patient is discharged from hospital.  The above assessment, treatment plan, treatment alternatives and goals were discussed and mutually agreed upon: by patient and by family  Hadrian Yarbrough 09/07/2011, 11:52 AM

## 2011-09-07 NOTE — Evaluation (Signed)
Recreational Therapy Assessment and Plan  Patient Details  Name: [name] MRN: [id number] Date of Birth: 21-May-1926 Today's Date: 09/07/2011  Rehab Potential: Good ELOS: 10 days   Assessment Clinical Impression: Problem List:  Patient Active Problem List   Diagnosis   .  PREMATURE VENTRICULAR CONTRACTIONS   .  PALPITATIONS   .  SDH (subdural hematoma)    Past Medical History:  Past Medical History   Diagnosis  Date   .  Subdural hematoma    .  Hypertension    .  Mitral valvular prolapse    .  CVA (cerebrovascular accident)     Past Surgical History:  Past Surgical History   Procedure  Date   .  Ines Bloomer hole  09/01/2011     Procedure: Ezekiel Ina; Surgeon: Tia Alert, MD; Location: [facility]; Service: Neurosurgery; Laterality: Left; Left Ezekiel Ina    Assessment & Plan  Clinical Impression: [name] is a 78 y.o. right-handed female with history of fall with bilateral small-chronic SDH per evaluation at [facility] about a month ago. Admitted via [facility] ED on 09/01/11 due to headaches, facial droop and speech difficulties. Follow up CCT with mild increase in SDH and as patient symptomatic, taken to OR on the same day for left frontal burr hole with placement of SD drain by Dr. Yetta Barre. Post op continues with confusion and a sitter was provided for her safety. Bedside swallow evaluation done and patient started on D3, thin liquids. Drain discontinued 09/04/2011. Patient transferred to CIR on 09/06/2011 .  Patient presents with decreased activity tolerance, decreased functional mobility, decreased balance, decreased coordination, decreased safety/awareness, decreased cognition limiting pt's independence with leisure/community pursuits.  Pt also with PMH of bilateral hip osteoarthritis.  Leisure History/Participation Premorbid leisure interest/current participation: Ashby Dawes - Flower gardening;Crafts - Knitting/Crocheting;Community - Press photographer - Physicist, medical Expression  Interests: Music (Comment);Play instrument (Comment);Singing (plays piano, organ, led choir at church) Other Leisure Interests: Reading;Cooking/Baking (reads through the Bible every year) Leisure Participation Style: With Family/Friends;Alone Awareness of Community Resources: Good-identify 3 post discharge leisure resources Psychosocial / Spiritual Spiritual Interests: Church Patient agreeable to Pet Therapy: Yes Does patient have pets?: No Social interaction - Mood/Behavior: Cooperative Film/video editor for Education?: Yes Recreational Therapy Orientation Orientation -Reviewed with patient: Available activity resources Strengths/Weaknesses Patient Strengths/Abilities: Willingness to participate Patient weaknesses: Physical limitations  Plan Rec Therapy Plan Is patient appropriate for Therapeutic Recreation?: Yes Rehab Potential: Good Treatment times per week: Min 1 time per weeek >20 minutes Estimated Length of Stay: 10 days TR Treatment/Interventions: Adaptive equipment instruction;1:1 session;Balance/vestibular training;Cognitive remediation/compensation;Community reintegration;Functional mobility training;Patient/family education;UE/LE Coordination activities;Therapeutic exercise;Therapeutic activities;Recreation/leisure participation  Recommendations for other services: None  Discharge Criteria: Patient will be discharged from TR if patient refuses treatment 3 consecutive times without medical reason.  If treatment goals not met, if there is a change in medical status, if patient makes no progress towards goals or if patient is discharged from hospital.  The above assessment, treatment plan, treatment alternatives and goals were discussed and mutually agreed upon: by patient  [name] 09/07/2011, 2:20 PM

## 2011-09-07 NOTE — Evaluation (Signed)
Occupational Therapy Assessment and Plan  Patient Details  Name: [name] MRN: [id number] Date of Birth: 1927/01/16  OT Diagnosis: acute pain, cognitive deficits, disturbance of vision and muscle weakness (generalized) Rehab Potential: Rehab Potential: Good ELOS: 10-12 days   Today's Date: 09/07/2011 Time: [phone] and 100-135 Time Calculation (min): 60 min and 35 min  Problem List:  Patient Active Problem List  Diagnosis  . PREMATURE VENTRICULAR CONTRACTIONS  . PALPITATIONS  . SDH (subdural hematoma)    Past Medical History:  Past Medical History  Diagnosis Date  . Subdural hematoma   . Hypertension   . Mitral valvular prolapse   . CVA (cerebrovascular accident)    Past Surgical History:  Past Surgical History  Procedure Date  . Ines Bloomer hole 09/01/2011    Procedure: Ezekiel Ina;  Surgeon: Tia Alert, MD;  Location: [facility];  Service: Neurosurgery;  Laterality: Left;  Left Ezekiel Ina     Assessment & Plan Clinical Impression: [name] is a 78 y.o. right-handed female with history of fall with bilateral small-chronic SDH per evaluation at [facility] about a month ago. Admitted via [facility] ED on 09/01/11 due to headaches, facial droop and speech difficulties. Follow up CCT with mild increase in SDH and as patient symptomatic, taken to OR on the same day for left frontal burr hole with placement of SD drain by Dr. Yetta Barre. Post op continues with confusion and a sitter was provided for her safety. Bedside swallow evaluation done and patient started on D3, thin liquids. Drain discontinued 09/04/2011.  Patient transferred to CIR on 09/06/2011 .    Patient currently requires min with basic self-care skills and IADL secondary to muscle weakness, decreased attention to left and decreased standing balance, decreased postural control and decreased balance strategies.  Prior to fall ~ 1 month ago, patient mod I will BADL and some IADL tasks with occasional assistance from family for  heavy housekeeping and some cooking. Patient will benefit from skilled intervention to increase independence with basic self-care skills and increase level of independence with iADL prior to discharge home with care partner.  Anticipate patient will require 24 hour supervision and follow up home health.  OT - End of Session Endurance Deficit: Yes OT Assessment Rehab Potential: Good OT Plan OT Frequency: 1-2 X/day, 60-90 minutes Estimated Length of Stay: 10-12 days OT Treatment/Interventions: Balance/vestibular training;Cognitive remediation/compensation;DME/adaptive equipment instruction;Discharge planning;Functional mobility training;Patient/family education;Neuromuscular re-education;Psychosocial support;Self Care/advanced ADL retraining;Therapeutic Activities;Therapeutic Exercise;UE/LE Coordination activities;UE/LE Strength taining/ROM OT Recommendation Follow Up Recommendations: Home health OT;24 hour supervision/assistance Equipment Recommended: Tub/shower seat;Tub/shower bench Equipment Details: Unsure if patient will use tub/shower or walk in shower.  Patient prefers to sit in bottom of the tub.  OT Evaluation Precautions/Restrictions  Precautions Precautions: Fall Restrictions Weight Bearing Restrictions: No Home Living/Prior Functioning Home Living Lives With: Alone (alone during day and someone present at night) Available Help at Discharge: Other (Comment) (help at night and daughter considering 24/7) Type of Home: House Home Access: Stairs to enter [employer] of Steps: 1 Entrance Stairs-Rails: None Home Layout: One level Bathroom Shower/Tub: Tub/shower unit;Curtain;Walk-in shower (either available) Bathroom Toilet: Standard Bathroom Accessibility: Yes How Accessible: Accessible via wheelchair Home Adaptive Equipment: Quad cane Prior Function Level of Independence: Independent with basic ADLs;Independent with gait Able to Take Stairs?: Yes Driving:  No Vocation: Retired Leisure: Hobbies-yes (Comment) Comments: Loves cooking, loves Systems developer, loves Archivist. Has read her Bible ~28 xs all the way through per daughter.  Vision/Perception  Vision - History Baseline Vision: Wears glasses  all the time (bifocals) Patient Visual Report:  (difficult to assess due to cognitive/communication deficits) Vision - Assessment Additional Comments: Will continue to assess during functional tasks  Cognition Overall Cognitive Status: Impaired Arousal/Alertness: Awake/alert Orientation Level: Oriented to person;Disoriented to situation;Disoriented to place;Disoriented to time Focused Attention: Appears intact Sustained Attention: Impaired Sustained Attention Impairment: Verbal basic;Functional basic Selective Attention: Impaired Selective Attention Impairment: Verbal basic;Functional basic Memory: Impaired Memory Impairment: Decreased short term memory;Decreased recall of new information Decreased Short Term Memory: Verbal basic;Functional basic Awareness: Impaired Awareness Impairment: Intellectual impairment Problem Solving: Impaired Problem Solving Impairment: Verbal basic;Functional basic Safety/Judgment: Impaired Comments: Pt will need 24 hour supervision. Sensation Sensation Light Touch: Appears Intact (per pt report, uncertain of reliability) Coordination Gross Motor Movements are Fluid and Coordinated: No Fine Motor Movements are Fluid and Coordinated: No Coordination and Movement Description: Pt with tremors of bil. UEs and oral musculature.  Mobility  Bed Mobility Bed Mobility: Rolling Left;Left Sidelying to Sit Rolling Left: 4: Min assist Rolling Left Details (indicate cue type and reason): Pt unable to perform supine to sit on own, attempted long sitting. With verbal and tactile cues pt more sucessful with rolling then side to sit.  Left Sidelying to Sit: 4: Min assist Left Sidelying to Sit Details (indicate cue type and  reason): Assist to upright trunk. Cues for initiation Sitting - Scoot to Edge of Bed: 5: Supervision Transfers Sit to Stand: 3: Mod assist;4: Min assist;From chair/3-in-1;From bed Sit to Stand Details (indicate cue type and reason): Initially moderate assist from bed progressing to min assist from wheelchair. Cues for UE placement. Pt tends to try to leave one arm on armrest of chair while in standing for added support.  Stand to Sit: 4: Min assist Stand to Sit Details: Cues for safe UE placement, backing up to chair prior to sitting.   Trunk/Postural Assessment  Cervical Assessment Cervical Assessment: Within Functional Limits Thoracic Assessment Thoracic Assessment: Within Functional Limits Lumbar Assessment Lumbar Assessment: Within Functional Limits  Balance Berg Balance Test Total Score: 8/56 Extremity/Trunk Assessment RUE Assessment RUE Assessment: Exceptions to [facility] RUE AROM (degrees) RUE Overall AROM Comments: AROM WFL except shoulder movements secondary to shoulder injury occured during fall ~1 month ago. Shoulder flexion ~80 degrees AROM RUE Strength RUE Overall Strength Comments: WFL during functional tasks/BADL.  Not formally assessed due to shoulder pain with AROM tasks LUE Assessment LUE Assessment: Exceptions to [facility] LUE AROM (degrees) LUE Overall AROM Comments: AROM WFL except end ranges of shoulder movements.  See FIM for current functional status Refer to Care Plan for Long Term Goals  Recommendations for other services: None  Discharge Criteria: Patient will be discharged from OT if patient refuses treatment 3 consecutive times without medical reason, if treatment goals not met, if there is a change in medical status, if patient makes no progress towards goals or if patient is discharged from hospital.  The above assessment, treatment plan, treatment alternatives and goals were discussed and mutually agreed upon: by patient and by family  Skilled  intervention: 1)  OT eval, self care retraining to include sponge bath and dressing.  Focus session on activity tolerance, sustained and selective attention, initiation and task completion, memory and orientation.  Patient's daughter present and providing assistance and support.  2)  Patient's 2 daughters present.  Rehab process and OT goals and recommendations reviewed.  Daughters asking appropriated questions and they understand the recommendation of 24/7 supervision upon discharge.  Focus session on UE assessment and attempted to screen  vision with minimal results.  Patient often interjects humor when unsure of answers...will continue to assess vision functionally.  Sarah Baez 09/07/2011, 2:22 PM

## 2011-09-07 NOTE — Progress Notes (Signed)
INITIAL ADULT NUTRITION ASSESSMENT Date: 09/07/2011   Time: 3:03 PM  Reason for Assessment: Health History  ASSESSMENT: Female 78 y.o.  Dx: SDH (subdural hematoma)  Hx:  Past Medical History  Diagnosis Date  . Subdural hematoma   . Hypertension   . Mitral valvular prolapse   . CVA (cerebrovascular accident)    Past Surgical History  Procedure Date  . Ines Bloomer hole 09/01/2011    Procedure: Ezekiel Ina;  Surgeon: Tia Alert, MD;  Location: [facility];  Service: Neurosurgery;  Laterality: Left;  Left Burr Holes    Related Meds:     . feeding supplement  237 mL Oral BID BM  . levothyroxine  75 mcg Oral QAC breakfast  . pantoprazole (PROTONIX) IV  40 mg Intravenous QHS  . potassium chloride  20 mEq Oral TID  . senna-docusate  1 tablet Oral BID  . triamterene-hydrochlorothiazide  1 each Oral Q M,W,F  . DISCONTD: triamterene-hydrochlorothiazide  1 each Oral Daily  . DISCONTD: triamterene-hydrochlorothiazide  1 each Oral Daily   Ht:  5\' 3"  (160 cm)  Wt: 104 lb 4.4 oz (47.3 kg)  Ideal Wt:    52.3 kg % Ideal Wt: 90%  Wt Readings from Last 15 Encounters:  09/06/11 104 lb 4.4 oz (47.3 kg)  09/05/11 100 lb 15.5 oz (45.8 kg)  09/05/11 100 lb 15.5 oz (45.8 kg)  12/21/08 115 lb (52.164 kg)  10/20/08 119 lb (53.978 kg)  10/09/08 115 lb (52.164 kg)  Usual Wt: 100 lb % Usual Wt: 104%  BMI is 18.5 - wnl  Food/Nutrition Related Hx: Regular diet PTA  Labs:  CMP     Component Value Date/Time   NA 142 09/07/2011 0630   K 2.7* 09/07/2011 0630   CL 103 09/07/2011 0630   CO2 28 09/07/2011 0630   GLUCOSE 91 09/07/2011 0630   BUN 14 09/07/2011 0630   CREATININE 0.78 09/07/2011 0630   CALCIUM 9.0 09/07/2011 0630   PROT 6.5 09/07/2011 0630   ALBUMIN 3.2* 09/07/2011 0630   AST 27 09/07/2011 0630   ALT 17 09/07/2011 0630   ALKPHOS 71 09/07/2011 0630   BILITOT 0.4 09/07/2011 0630   GFRNONAA 75* 09/07/2011 0630   GFRAA 87* 09/07/2011 0630     Intake/Output Summary (Last 24 hours) at  09/07/11 1504 Last data filed at 09/07/11 1300  Gross per 24 hour  Intake    720 ml  Output      0 ml  Net    720 ml     Diet Order: Dysphagia 3 with thins  Supplements/Tube Feeding: Ensure Complete PO BID  IVF:    Estimated Nutritional Needs:   Kcal: 1100 - 1300 kcal Protein: 50 - 60 grams Fluid:  1.2 - 1.4 liters daily  Pt admitted with mild increase in SDH. Bedside swallow evaluation done and patient started on D3, thin liquids.  Currently eating 50 - 75%. Pt with pressure ulcer on admission. Family requesting pt to change Ensure to Glucerna. RN reports pt does not have any hx of DM per records. However, this RD will change Ensure to Glucerna per family preferences. Family checks CBGs at home.  NUTRITION DIAGNOSIS: -Increased nutrient needs (NI-5.1).  Status: Ongoing  RELATED TO: participation in therapies  AS EVIDENCE BY: estimated needs  MONITORING/EVALUATION(Goals): Goal: Pt to meet >/= 90% of their estimated nutrition needs Monitor: weights, labs, PO intake, I/O's  EDUCATION NEEDS: -No education needs identified at this time  INTERVENTION: 1. Change Ensure Complete to  Glucerna Shake - per family preference 2. RD to continue to follow nutrition care plan  DOCUMENTATION CODES Per approved criteria  -Not Applicable   Jarold Motto MS, RD, LDN Pager: [phone] After-hours pager: [phone]

## 2011-09-07 NOTE — Progress Notes (Signed)
CRITICAL VALUE ALERT  Critical value received: potassium 2.7   Date of notification:  09-07-11  Time of notification:  0928  Critical value read back:yes  Nurse who received alert:  Oletha Cruel ,RN  MD notified (1st page):  D. Anguilli , PA  Time of first page:  0929  MD notified (2nd page):  Time of second page:  Responding MD:  D.Anguilli ,PA  Time MD responded: [id number]

## 2011-09-07 NOTE — Evaluation (Signed)
Physical Therapy Assessment and Plan  Patient Details  Name: [name] MRN: [id number] Date of Birth: 10-Jan-1927  PT Diagnosis: Abnormality of gait, Cognitive deficits, Coordination disorder, Difficulty walking, Impaired cognition, Muscle weakness and Osteoarthritis Rehab Potential: Good ELOS: 1.5-2 weeks   Today's Date: 09/07/2011 Time: 0730-0830 Time Calculation (min): 60 min  Problem List:  Patient Active Problem List  Diagnosis  . PREMATURE VENTRICULAR CONTRACTIONS  . PALPITATIONS  . SDH (subdural hematoma)    Past Medical History:  Past Medical History  Diagnosis Date  . Subdural hematoma   . Hypertension   . Mitral valvular prolapse   . CVA (cerebrovascular accident)    Past Surgical History:  Past Surgical History  Procedure Date  . Ines Bloomer hole 09/01/2011    Procedure: Ezekiel Ina;  Surgeon: Tia Alert, MD;  Location: [facility];  Service: Neurosurgery;  Laterality: Left;  Left Ezekiel Ina     Assessment & Plan Clinical Impression: [name] is a 78 y.o. right-handed female with history of fall with bilateral small-chronic SDH per evaluation at [facility] about a month ago. Admitted via [facility] ED on 09/01/11 due to headaches, facial droop and speech difficulties. Follow up CCT with mild increase in SDH and as patient symptomatic, taken to OR on the same day for left frontal burr hole with placement of SD drain by Dr. Yetta Barre. Post op continues with confusion and a sitter was provided for her safety. Bedside swallow evaluation done and patient started on D3, thin liquids. Drain discontinued 09/04/2011. Patient transferred to CIR on 09/06/2011 .   Patient currently requires mod with mobility secondary to muscle weakness, impaired timing and sequencing, unbalanced muscle activation, decreased coordination and decreased motor planning, decreased initiation, decreased awareness, decreased problem solving, decreased safety awareness, decreased memory and delayed processing,   and decreased standing balance and decreased balance strategies.  Shuffling gait at baseline secondary bil. Hip osteoarthritis. During stay has had agitation and confusion but pleasant on eval. Therapies will work with pt to improve dynamic/static balance (Berg Balance Score 8/56 indicating 100% risk for falls), strength, endurance, and incorporating cognitive challenges with functional tasks.Prior to hospitalization, patient was independent prior to fall however daughter reports pt had assist at night post fall. She lived with Alone (alone during day and someone present at night) in a House home.  Home access is 1Stairs to enter.  Patient will benefit from skilled PT intervention to maximize safe functional mobility, minimize fall risk and decrease caregiver burden for planned discharge home with 24 hour supervision/assist.  Anticipate patient will benefit from follow up [facility] at discharge.  PT - End of Session Endurance Deficit: Yes Endurance Deficit Description: Impaired standing endurance. PT Assessment Rehab Potential: Good Barriers to Discharge: Decreased caregiver support PT Plan PT Frequency: 2-3 X/day, 60-90 minutes Estimated Length of Stay: 1.5-2 weeks PT Treatment/Interventions: Ambulation/gait training;Balance/vestibular training;Cognitive remediation/compensation;Discharge planning;DME/adaptive equipment instruction;Functional mobility training;Neuromuscular re-education;Patient/family education;Pain management;Psychosocial support;Stair training;Therapeutic Activities;Therapeutic Exercise;UE/LE Strength taining/ROM;UE/LE Coordination activities;Wheelchair propulsion/positioning PT Recommendation Follow Up Recommendations: Home health PT;24 hour supervision/assistance Equipment Recommended: Wheelchair cushion (measurements);Wheelchair (measurements) (for long distances and MD appointments)  PT Evaluation Precautions/Restrictions Precautions Precautions: Fall Restrictions Weight  Bearing Restrictions: No  Vital Signs Therapy Vitals Temp: 98 F (36.7 C) Temp src: Oral Pulse Rate: 64  Resp: 18  BP: 161/62 mmHg Patient Position, if appropriate: Lying Oxygen Therapy SpO2: 98 % O2 Device: None (Room air) Pulse Oximetry Type: Intermittent Pain Pain Assessment Pain Assessment: No/denies pain however with walking pt reports bil. Hip pain  not rated. Home Living/Prior Functioning Home Living Lives With: Alone (alone during day and someone present at night) Available Help at Discharge: Other (Comment) (help at night and daughter considering 24/7) Type of Home: House Home Access: Stairs to enter [employer] of Steps: 1 Entrance Stairs-Rails: None Home Layout: One level Bathroom Shower/Tub: Tub/shower unit;Curtain;Walk-in shower (either available) Bathroom Toilet: Standard Bathroom Accessibility: Yes How Accessible: Accessible via wheelchair Home Adaptive Equipment: Quad cane Prior Function Level of Independence: Independent with basic ADLs;Independent with gait Able to Take Stairs?: Yes Driving: No Vocation: Retired Leisure: Hobbies-yes (Comment) Comments: Loves cooking, loves Systems developer, loves Archivist. Has read her Bible ~28 xs all the way through per daughter.   Cognition Overall Cognitive Status: Impaired Arousal/Alertness: Awake/alert Orientation Level: Oriented to person;Disoriented to place;Disoriented to time;Disoriented to situation Focused Attention: Appears intact Sustained Attention: Impaired Sustained Attention Impairment: Verbal complex;Functional complex Selective Attention: Impaired Selective Attention Impairment: Verbal complex;Functional complex Memory: Impaired Memory Impairment: Decreased recall of new information;Decreased long term memory;Decreased short term memory;Retrieval deficit;Storage deficit Awareness: Impaired Awareness Impairment: Intellectual impairment;Anticipatory impairment Problem Solving:  Impaired Problem Solving Impairment: Functional basic;Verbal basic Safety/Judgment: Impaired Comments: Pt will need 24 hour supervision. Sensation Sensation Light Touch: Appears Intact (per pt report, uncertain of reliability) Coordination Gross Motor Movements are Fluid and Coordinated: No Fine Motor Movements are Fluid and Coordinated: No Coordination and Movement Description: Pt with tremors of bil. UEs and oral musculature.   Mobility Bed Mobility Bed Mobility: Rolling Left;Left Sidelying to Sit Rolling Left: 4: Min assist Rolling Left Details (indicate cue type and reason): Pt unable to perform supine to sit on own, attempted long sitting. With verbal and tactile cues pt more sucessful with rolling then side to sit.  Left Sidelying to Sit: 4: Min assist Left Sidelying to Sit Details (indicate cue type and reason): Assist to upright trunk. Cues for initiation Sitting - Scoot to Edge of Bed: 5: Supervision Transfers Sit to Stand: 3: Mod assist;4: Min assist;From chair/3-in-1;From bed Sit to Stand Details (indicate cue type and reason): Initially moderate assist from bed progressing to min assist from wheelchair. Cues for UE placement. Pt tends to try to leave one arm on armrest of chair while in standing for added support.  Stand to Sit: 4: Min assist Stand to Sit Details: Cues for safe UE placement, backing up to chair prior to sitting.  Stand Pivot Transfers: 3: Mod assist Stand Pivot Transfer Details (indicate cue type and reason): Moderate assist for initial portion of transfer progressing to min assist. Locomotion  Ambulation Ambulation: Yes Ambulation/Gait Assistance: 3: Mod assist Ambulation Distance (Feet): 40 Feet Gait Gait: Yes Gait Pattern: Impaired Gait Pattern: Shuffle;Step-through pattern;Decreased stride length;Trunk flexed;Decreased dorsiflexion - right;Decreased dorsiflexion - left (minimal foot clearance - slides feet) High Level Ambulation High Level  Ambulation: Backwards walking Backwards Walking: moderate assist, posterior loss of balance noted. Stairs / Additional Locomotion Stairs: Yes Stairs Assistance: 3: Mod assist Stairs Assistance Details (indicate cue type and reason): Verbal cues for sequencing and safe UE placement Stair Management Technique: Two rails;Alternating pattern;Forwards Number of Stairs: 2  Height of Stairs: 6  (inch) Wheelchair Mobility Wheelchair Mobility: Yes Wheelchair Assistance: 4: Min Education officer, museum: Both upper extremities Wheelchair Parts Management: Needs assistance Distance: Pt needs assist with turning and tight space negotiation. Straight path negotiaiton pt is supervision. 33'  Trunk/Postural Assessment  Cervical Assessment Cervical Assessment: Within Functional Limits Thoracic Assessment Thoracic Assessment: Within Functional Limits Lumbar Assessment Lumbar Assessment: Within Functional Limits  Balance Standardized  Balance Assessment Standardized Balance Assessment: Berg Balance Test Berg Balance Test Sit to Stand: Needs moderate or maximal assist to stand Standing Unsupported: Unable to stand 30 seconds unassisted Sitting with Back Unsupported but Feet Supported on Floor or Stool: Able to sit safely and securely 2 minutes Stand to Sit: Needs assistance to sit Transfers: Needs one person to assist Standing Unsupported with Eyes Closed: Needs help to keep from falling Standing Ubsupported with Feet Together: Needs help to attain position and unable to hold for 15 seconds From Standing, Reach Forward with Outstretched Arm: Reaches forward but needs supervision From Standing Position, Pick up Object from Floor: Unable to try/needs assist to keep balance From Standing Position, Turn to Look Behind Over each Shoulder: Needs assist to keep from losing balance and falling Turn 360 Degrees: Needs assistance while turning Standing Unsupported, Alternately Place Feet on Step/Stool:  Needs assistance to keep from falling or unable to try Standing Unsupported, One Foot in Front: Needs help to step but can hold 15 seconds Standing on One Leg: Tries to lift leg/unable to hold 3 seconds but remains standing independently Total Score: 8  Extremity Assessment      RLE Assessment RLE Assessment: Exceptions to [facility] RLE Strength RLE Overall Strength Comments: Generalized deconditioning, grossly >/= 3+/5.  LLE Assessment LLE Assessment: Exceptions to [facility] LLE Strength LLE Overall Strength Comments: Generalized deconditioning, grossly >/= 3+/5.    Skilled Therapeutic Interventions/Progress Updates:  Pt with noted significant difficulty with balance static and dynamically. She is very anxious with balance tasks. Practiced static stance activities and dynamic stance working on varied bases of support, overall min assist to prevent fall. Pt trying to reach out to objects for support, will try pt without RW at first however for safe return home she may benefit from RW for support and safety.   See FIM for current functional status Refer to Care Plan for Long Term Goals  Recommendations for other services: None  Discharge Criteria: Patient will be discharged from PT if patient refuses treatment 3 consecutive times without medical reason, if treatment goals not met, if there is a change in medical status, if patient makes no progress towards goals or if patient is discharged from hospital.  The above assessment, treatment plan, treatment alternatives and goals were discussed and mutually agreed upon: by patient  [name] 09/07/2011, 9:01 AM

## 2011-09-08 ENCOUNTER — Inpatient Hospital Stay (HOSPITAL_COMMUNITY): Payer: BLUE CROSS/BLUE SHIELD | Admitting: Physical Therapy

## 2011-09-08 ENCOUNTER — Inpatient Hospital Stay (HOSPITAL_COMMUNITY): Payer: Medicare Other | Admitting: Speech Pathology

## 2011-09-08 ENCOUNTER — Inpatient Hospital Stay (HOSPITAL_COMMUNITY): Payer: Medicare Other

## 2011-09-08 LAB — BASIC METABOLIC PANEL
CO2: 31 mEq/L (ref 19–32)
CO2: 28 mEq/L (ref 19–32)
Chloride: 106 mEq/L (ref 96–112)
Glucose, Bld: 98 mg/dL (ref 70–99)
Potassium: 3.8 mEq/L (ref 3.5–5.1)
Potassium: 3.4 mEq/L — ABNORMAL LOW (ref 3.5–5.1)
Sodium: 144 mEq/L (ref 135–145)
Sodium: 143 mEq/L (ref 135–145)

## 2011-09-08 LAB — GLUCOSE, CAPILLARY: Glucose-Capillary: 117 mg/dL — ABNORMAL HIGH (ref 70–99)

## 2011-09-08 LAB — HEMOGLOBIN A1C: Mean Plasma Glucose: 114 mg/dL (ref <117)

## 2011-09-08 MED ORDER — POTASSIUM CHLORIDE CRYS ER 20 MEQ PO TBCR
20.0000 meq | EXTENDED_RELEASE_TABLET | Freq: Two times a day (BID) | ORAL | Status: DC
  Administered 2011-09-08 – 2011-09-15 (×15): 20 meq via ORAL
  Filled 2011-09-08 (×17): qty 1

## 2011-09-08 MED ORDER — PANTOPRAZOLE SODIUM 40 MG PO TBEC
40.0000 mg | DELAYED_RELEASE_TABLET | Freq: Every day | ORAL | Status: DC
  Administered 2011-09-08 – 2011-09-14 (×7): 40 mg via ORAL
  Filled 2011-09-08 (×7): qty 1

## 2011-09-08 NOTE — Evaluation (Signed)
Speech Language Pathology Bedside Swallow Evaluation  Patient Details  Name: [name] MRN: [id number] Date of Birth: 1926/03/27  Today's Date: 09/08/2011 Time: 0730-0825 Time Calculation (min): 55 min  Skilled Therapeutic Intervention: Pt administered BSE: Please see below for details   Problem List:  Patient Active Problem List  Diagnosis  . PREMATURE VENTRICULAR CONTRACTIONS  . PALPITATIONS  . SDH (subdural hematoma)   Past Medical History:  Past Medical History  Diagnosis Date  . Subdural hematoma   . Hypertension   . Mitral valvular prolapse   . CVA (cerebrovascular accident)    Past Surgical History:  Past Surgical History  Procedure Date  . Ines Bloomer hole 09/01/2011    Procedure: Ezekiel Ina;  Surgeon: Tia Alert, MD;  Location: [facility];  Service: Neurosurgery;  Laterality: Left;  Left Burr Holes     Assessment / Plan / Recommendation Clinical Impression  Pt administered BSE and presents with mild cognitive based dysphagia characterized by decreased sustained attention to bolus. Pt demonstrated throat clear X 1 when talking with bolus in oral cavity. Throat clearing eliminated when distractions reduced. Recommend regular textures with thin liquids.     SLP Assessment  Patient will need skilled Speech Lanaguage Pathology Services during CIR admission    Recommendations  Follow up Recommendations: Home Health SLP Equipment Recommended: None recommended by SLP    SLP Frequency 1-2 X/day, 30-60 minutes   SLP Treatment/Interventions Cognitive remediation/compensation;Dysphagia/aspiration precaution training;Cueing hierarchy;Environmental controls;Functional tasks;Patient/family education;Therapeutic Activities;Internal/external aids    Pain No/Denies Pain  Short Term Goals: Week 1: SLP Short Term Goal 1 (Week 1): Pt will utilize call bell to express wants/needs witth Mod A question and semantic cues. SLP Short Term Goal 2 (Week 1): Pt will sustain attention  to task for ~10 minutes with Min A cues for redirection SLP Short Term Goal 3 (Week 1): Pt will demonstrate functional problem solving for basic and familiar tasks with Mod A verbald and question cues.  SLP Short Term Goal 4 (Week 1): Pt will utilize external memory aids to recall daily information (schedule, calendar, etc) with Mod A verbal and visual cues. SLP Short Term Goal 5 (Week 1): Pt will orient to person, place, situation and time with Mod A semantic and visual cues.  SLP Short Term Goal 6 (Week 1): Pt will consume regular textures and thin liquids without overt s/s of aspiration with supervision verbal cues  See FIM for current functional status Refer to Care Plan for Long Term Goals  Recommendations for other services: None  Discharge Criteria: Patient will be discharged from SLP if patient refuses treatment 3 consecutive times without medical reason, if treatment goals not met, if there is a change in medical status, if patient makes no progress towards goals or if patient is discharged from hospital.  The above assessment, treatment plan, treatment alternatives and goals were discussed and mutually agreed upon: by patient and by family  Leylah Tarnow 09/08/2011, 8:38 AM

## 2011-09-08 NOTE — Plan of Care (Signed)
Problem: RH SAFETY Goal: RH STG ADHERE TO SAFETY PRECAUTIONS W/ASSISTANCE/DEVICE STG Adhere to Safety Precautions With Min Assistance/Device.  Outcome: Progressing No unsafe behavior noted this shift

## 2011-09-08 NOTE — Progress Notes (Signed)
Physical Therapy Session Note  Patient Details  Name: Anzleigh Slaven MRN: [id number] Date of Birth: March 31, 1926  Today's Date: 09/08/2011 Time: 7829-5621 Time Calculation (min): 30 min  Short Term Goals: Week 1:  PT Short Term Goal 1 (Week 1): Pt will perform sit <> stands with supervision consistently.  PT Short Term Goal 2 (Week 1): Pt will ambulate 100' with min assist. PT Short Term Goal 3 (Week 1): Pt will perform dynamic standing balance activities with min assist.  Skilled Therapeutic Interventions/Progress Updates:    Gait training in controlled environment 2 x 160' with min assist. Pt frequently reaches out to wall and rail for extra support, may try rollator walker or RW with pt prior to D/C. Home environment ambulation x 75' with up to min assist. Practiced reaching for objects high and low, memory tasks incorporated to remember where items belonged in cabinets. Mod/max verbal cues needed for memory tasks. Sit <> stands at various heights and surfaces (low couch, bed, and armless low chair) all performed with close supervision.     Therapy Documentation Precautions:  Precautions Precautions: Fall Restrictions Weight Bearing Restrictions: No Pain: Pain Assessment Pain Assessment: No/denies pain  See FIM for current functional status  Therapy/Group: Individual Therapy  [name] 09/08/2011, 5:32 PM

## 2011-09-08 NOTE — Progress Notes (Signed)
Patient ID: [name], female   DOB: [birth year], 78 y.o.   MRN: [id number]   Subjective/Complaints: Did you know I was diabetic? Blood sugars normal thus far  Objective: Vital Signs: Blood pressure 156/72, pulse 63, temperature 97.4 F (36.3 C), temperature source Oral, resp. rate 18, weight 47.3 kg (104 lb 4.4 oz), SpO2 99.00%. No results found. Results for orders placed during the hospital encounter of 09/06/11 (from the past 72 hour(s))  CBC WITH DIFFERENTIAL     Status: Abnormal   Collection Time   09/07/11  6:30 AM      Component Value Range Comment   WBC 5.3  4.0 - 10.5 K/uL    RBC 3.88  3.87 - 5.11 MIL/uL    Hemoglobin 12.0  12.0 - 15.0 g/dL    HCT 40.9 (*) 81.1 - 46.0 %    MCV 91.0  78.0 - 100.0 fL    MCH 30.9  26.0 - 34.0 pg    MCHC 34.0  30.0 - 36.0 g/dL    RDW 91.4  78.2 - 95.6 %    Platelets 172  150 - 400 K/uL    Neutrophils Relative 50  43 - 77 %    Neutro Abs 2.6  1.7 - 7.7 K/uL    Lymphocytes Relative 35  12 - 46 %    Lymphs Abs 1.8  0.7 - 4.0 K/uL    Monocytes Relative 10  3 - 12 %    Monocytes Absolute 0.6  0.1 - 1.0 K/uL    Eosinophils Relative 5  0 - 5 %    Eosinophils Absolute 0.2  0.0 - 0.7 K/uL    Basophils Relative 1  0 - 1 %    Basophils Absolute 0.0  0.0 - 0.1 K/uL   COMPREHENSIVE METABOLIC PANEL     Status: Abnormal   Collection Time   09/07/11  6:30 AM      Component Value Range Comment   Sodium 142  135 - 145 mEq/L    Potassium 2.7 (*) 3.5 - 5.1 mEq/L    Chloride 103  96 - 112 mEq/L    CO2 28  19 - 32 mEq/L    Glucose, Bld 91  70 - 99 mg/dL    BUN 14  6 - 23 mg/dL    Creatinine, Ser 2.13  0.50 - 1.10 mg/dL    Calcium 9.0  8.4 - 08.6 mg/dL    Total Protein 6.5  6.0 - 8.3 g/dL    Albumin 3.2 (*) 3.5 - 5.2 g/dL    AST 27  0 - 37 U/L    ALT 17  0 - 35 U/L    Alkaline Phosphatase 71  39 - 117 U/L    Total Bilirubin 0.4  0.3 - 1.2 mg/dL    GFR calc non Af Amer 75 (*) >90 mL/min    GFR calc Af Amer 87 (*) >90 mL/min      HEENT:  normal Cardio: RRR Resp: CTA B/L GI: BS positive Extremity:  Pulses positive Skin:   Intact Neuro: Confused, Cranial Nerve II-XII normal, Normal Sensory and Apraxic Musc/Skel:  Extremity tender R shoulder AC joint pain   Assessment/Plan: 1. Functional deficits secondary to L frontal SDH with cognitive , behaovior deficits and apraxia which require 3+ hours per day of interdisciplinary therapy in a comprehensive inpatient rehab setting. Physiatrist is providing close team supervision and 24 hour management of active medical problems listed below. Physiatrist and rehab team continue to assess  barriers to discharge/monitor patient progress toward functional and medical goals. FIM: FIM - Bathing Bathing Steps Patient Completed: Chest;Left Arm;Abdomen;Right Arm;Front perineal area;Buttocks;Right upper leg;Left upper leg;Right lower leg (including foot);Left lower leg (including foot) Bathing: 4: Steadying assist  FIM - Upper Body Dressing/Undressing Upper body dressing/undressing steps patient completed: Thread/unthread right sleeve of pullover shirt/dresss;Thread/unthread left sleeve of pullover shirt/dress;Put head through opening of pull over shirt/dress;Pull shirt over trunk;Thread/unthread right bra strap;Thread/unthread left bra strap;Hook/unhook bra Upper body dressing/undressing: 5: Set-up assist to: Obtain clothing/put away FIM - Lower Body Dressing/Undressing Lower body dressing/undressing steps patient completed: Pull underwear up/down;Thread/unthread right underwear leg;Thread/unthread left underwear leg;Thread/unthread right pants leg;Thread/unthread left pants leg;Pull pants up/down;Don/Doff left sock Lower body dressing/undressing: 3: Mod-Patient completed 50-74% of tasks  FIM - Toileting Toileting steps completed by patient: Adjust clothing prior to toileting;Performs perineal hygiene Toileting Assistive Devices: Grab bar or rail for support Toileting: 3: Mod-Patient completed  2 of 3 steps  FIM - Archivist Transfers: 5-To toilet/BSC: Supervision (verbal cues/safety issues)  FIM - Games developer Transfer: 4: Supine > Sit: Min A (steadying Pt. > 75%/lift 1 leg);4: Sit > Supine: Min A (steadying pt. > 75%/lift 1 leg);3: Bed > Chair or W/C: Mod A (lift or lower assist);3: Chair or W/C > Bed: Mod A (lift or lower assist)  FIM - Locomotion: Wheelchair Distance: Pt needs assist with turning and tight space negotiation. Straight path negotiaiton pt is supervision. 90' Locomotion: Wheelchair: 2: Travels 50 - 149 ft with minimal assistance (Pt.>75%) FIM - Locomotion: Ambulation Ambulation/Gait Assistance: 3: Mod assist Locomotion: Ambulation: 1: Travels less than 50 ft with moderate assistance (Pt: 50 - 74%)  Comprehension Comprehension Mode: Auditory Comprehension: 4-Understands basic 75 - 89% of the time/requires cueing 10 - 24% of the time  Expression Expression Mode: Verbal Expression: 2-Expresses basic 25 - 49% of the time/requires cueing 50 - 75% of the time. Uses single words/gestures.  Social Interaction Social Interaction: 4-Interacts appropriately 75 - 89% of the time - Needs redirection for appropriate language or to initiate interaction.  Problem Solving Problem Solving: 2-Solves basic 25 - 49% of the time - needs direction more than half the time to initiate, plan or complete simple activities  Memory Memory: 2-Recognizes or recalls 25 - 49% of the time/requires cueing 51 - 75% of the time  Medical Problem List and Plan:  1. Bilateral chronic subdural hematoma. Status post left frontal burr hole 09/01/2011  2. DVT Prophylaxis/Anticoagulation: SCDs. Monitor for any signs of DVT  3. Mood. Monitor safety and check sleep chart. Patient will need a bed alarm for safety. Encouraged family to stay at night  4. Pain Management: Currently only on Tylenol for pain. Will monitor with increased activity  5. Neuropsych: This patient  is not capable of making decisions on his/her own behalf.  6. Hypertension. Maxzide daily. Monitor for any signs of orthostasis Recheck BMET, will need K supp 7. Hypothyroidism. Synthroid 8.  R AC joint separation May 2013 with residual pain no precautions 9.  ? DM check HgA1c, daily CBG LOS (Days) 2 A FACE TO FACE EVALUATION WAS PERFORMED  [name] 09/08/2011, 7:30 AM

## 2011-09-08 NOTE — Progress Notes (Signed)
Occupational Therapy Session Note  Patient Details  Name: Sterling Ucci MRN: [id number] Date of Birth: May 15, 1926  Today's Date: 09/08/2011 Time: 0900-1000 Time Calculation (min): 60 min  Short Term Goals: Week 1:  OT Short Term Goal 1 (Week 1): STG=LTG secondary to estimated SLOS  Skilled Therapeutic Interventions/Progress Updates:    Pt in bed resting with daughter present.  Pt walked from bed to w/c at sink with HHA to engage in bathing and dressing tasks.  Pt required max verbal cues for initiation and sequencing.  Pt completed UB bathing and dressing tasks seated and requested to go to bathroom to complete LB bating and dressing.  At this time pt stated that she preferred that a female not assist her.  Pt's daughter assisted pt with completing LB tasks while in bathroom.  Pt required overall min A/supervision to complete all tasks with assistance for transfers, standing, and verbal cues for initiation and sequencing.  Therapy Documentation Precautions:  Precautions Precautions: Fall Restrictions Weight Bearing Restrictions: No   Pain: Pain Assessment Pain Assessment: No/denies pain  See FIM for current functional status  Therapy/Group: Individual Therapy  Rich Brave 09/08/2011, 11:20 AM

## 2011-09-08 NOTE — Plan of Care (Signed)
Problem: RH BLADDER ELIMINATION Goal: RH STG MANAGE BLADDER WITH ASSISTANCE STG Manage Bladder With Min Assistance  Outcome: Progressing No incontinent episode reported.

## 2011-09-08 NOTE — Progress Notes (Signed)
Physical Therapy Session Note  Patient Details  Name: [name] MRN: [id number] Date of Birth: 08/24/1926  Today's Date: 09/08/2011 Time: 1117-1202 Time Calculation (min): 45 min  Short Term Goals: Week 1:  PT Short Term Goal 1 (Week 1): Pt will perform sit <> stands with supervision consistently.  PT Short Term Goal 2 (Week 1): Pt will ambulate 100' with min assist. PT Short Term Goal 3 (Week 1): Pt will perform dynamic standing balance activities with min assist.  Skilled Therapeutic Interventions/Progress Updates:    Session focused on balance + cognitive challenges. Performed ball toss with daughter while varying base of support and performing simple cognitive tasks. Foot taps to colored rings working single limb stance and 1 step memory sequence. Pt required min assist and moderate - maximum verbal cues due to impaired memory and processing. Gait training back to room in mod distracting environment x 120' with min assist.   Also performed car transfer x 2 reps (1 with PT,  1 with daughter providing min assist). Step by step cues required for pt secondary to memory and decreased safety awareness. Daughter did well with instruction and cues for positioning.  Therapy Documentation Precautions:  Precautions Precautions: Fall Restrictions Weight Bearing Restrictions: No Pain: Pain Assessment Pain Assessment: No/denies pain  See FIM for current functional status  Therapy/Group: Individual Therapy  [name] 09/08/2011, 12:07 PM

## 2011-09-09 ENCOUNTER — Inpatient Hospital Stay (HOSPITAL_COMMUNITY): Payer: Medicare Other

## 2011-09-09 ENCOUNTER — Inpatient Hospital Stay (HOSPITAL_COMMUNITY): Payer: Medicare Other | Admitting: Speech Pathology

## 2011-09-09 ENCOUNTER — Inpatient Hospital Stay (HOSPITAL_COMMUNITY): Payer: Medicare Other | Admitting: Physical Therapy

## 2011-09-09 ENCOUNTER — Inpatient Hospital Stay (HOSPITAL_COMMUNITY): Payer: Medicare Other | Admitting: *Deleted

## 2011-09-09 LAB — GLUCOSE, CAPILLARY
Glucose-Capillary: 89 mg/dL (ref 70–99)
Glucose-Capillary: 147 mg/dL — ABNORMAL HIGH (ref 70–99)

## 2011-09-09 NOTE — Progress Notes (Signed)
Occupational Therapy Session Note  Patient Details  Name: [name] MRN: [id number] Date of Birth: 1926/07/29  Today's Date: 09/09/2011 Time: 1005-1100 Time Calculation (min): 55 min  Short Term Goals: Week 1:  OT Short Term Goal 1 (Week 1): STG=LTG secondary to estimated SLOS  Skilled Therapeutic Interventions: ADL-retraining at sink in w/c with emphasis on sequencing, sustained attention, communication, and problem-solving.  Patient has already declared that she would not bathe and dress with female attending but she agreed to complete grooming and hygiene with supervision and assist, as needed.  Patient exhibits cognitive deficits with short-term memory, temporal orientation, difficulty with word-finding and naming objects, error recognition, perseveration with tasks and confusion with directions.  Patient became distracted during her ADL and appeared to need items not present for oral and hair care but she could not describe them by name or determine their location, despite repeated cues and suggestions.  Patient also became agitated when asked to read a chronology of events provided by SLP, stating, "well of course I CAN read it. . . where have you been all this time; just messing around?"   Patient appears to favor the expression, "messing around" when asked for details about other statements she makes.     Therapy Documentation Precautions:  Precautions Precautions: Fall Restrictions Weight Bearing Restrictions: No  Pain: Pain Assessment Pain Assessment: No/denies pain  See FIM for current functional status  Therapy/Group: Individual Therapy  Georgeanne Nim 09/09/2011, 12:28 PM

## 2011-09-09 NOTE — Progress Notes (Signed)
Patient ID: [name], female   DOB: [birth year], 78 y.o.   MRN: [id number]   Subjective/Complaints: Did you know I was diabetic? Blood sugars normal thus far  Objective: Vital Signs: Blood pressure 155/76, pulse 64, temperature 97.9 F (36.6 C), temperature source Oral, resp. rate 18, weight 47.3 kg (104 lb 4.4 oz), SpO2 95.00%. No results found. Results for orders placed during the hospital encounter of 09/06/11 (from the past 72 hour(s))  CBC WITH DIFFERENTIAL     Status: Abnormal   Collection Time   09/07/11  6:30 AM      Component Value Range Comment   WBC 5.3  4.0 - 10.5 K/uL    RBC 3.88  3.87 - 5.11 MIL/uL    Hemoglobin 12.0  12.0 - 15.0 g/dL    HCT 69.6 (*) 29.5 - 46.0 %    MCV 91.0  78.0 - 100.0 fL    MCH 30.9  26.0 - 34.0 pg    MCHC 34.0  30.0 - 36.0 g/dL    RDW 28.4  13.2 - 44.0 %    Platelets 172  150 - 400 K/uL    Neutrophils Relative 50  43 - 77 %    Neutro Abs 2.6  1.7 - 7.7 K/uL    Lymphocytes Relative 35  12 - 46 %    Lymphs Abs 1.8  0.7 - 4.0 K/uL    Monocytes Relative 10  3 - 12 %    Monocytes Absolute 0.6  0.1 - 1.0 K/uL    Eosinophils Relative 5  0 - 5 %    Eosinophils Absolute 0.2  0.0 - 0.7 K/uL    Basophils Relative 1  0 - 1 %    Basophils Absolute 0.0  0.0 - 0.1 K/uL   COMPREHENSIVE METABOLIC PANEL     Status: Abnormal   Collection Time   09/07/11  6:30 AM      Component Value Range Comment   Sodium 142  135 - 145 mEq/L    Potassium 2.7 (*) 3.5 - 5.1 mEq/L    Chloride 103  96 - 112 mEq/L    CO2 28  19 - 32 mEq/L    Glucose, Bld 91  70 - 99 mg/dL    BUN 14  6 - 23 mg/dL    Creatinine, Ser 1.02  0.50 - 1.10 mg/dL    Calcium 9.0  8.4 - 72.5 mg/dL    Total Protein 6.5  6.0 - 8.3 g/dL    Albumin 3.2 (*) 3.5 - 5.2 g/dL    AST 27  0 - 37 U/L    ALT 17  0 - 35 U/L    Alkaline Phosphatase 71  39 - 117 U/L    Total Bilirubin 0.4  0.3 - 1.2 mg/dL    GFR calc non Af Amer 75 (*) >90 mL/min    GFR calc Af Amer 87 (*) >90 mL/min   BASIC METABOLIC PANEL      Status: Abnormal   Collection Time   09/08/11  6:45 AM      Component Value Range Comment   Sodium 144  135 - 145 mEq/L    Potassium 3.8  3.5 - 5.1 mEq/L    Chloride 106  96 - 112 mEq/L    CO2 31  19 - 32 mEq/L    Glucose, Bld 92  70 - 99 mg/dL    BUN 16  6 - 23 mg/dL    Creatinine, Ser 3.66  0.50 -  1.10 mg/dL    Calcium 9.1  8.4 - 16.1 mg/dL    GFR calc non Af Amer 75 (*) >90 mL/min    GFR calc Af Amer 87 (*) >90 mL/min   BASIC METABOLIC PANEL     Status: Abnormal   Collection Time   09/08/11  9:45 AM      Component Value Range Comment   Sodium 143  135 - 145 mEq/L    Potassium 3.4 (*) 3.5 - 5.1 mEq/L    Chloride 105  96 - 112 mEq/L    CO2 28  19 - 32 mEq/L    Glucose, Bld 98  70 - 99 mg/dL    BUN 15  6 - 23 mg/dL    Creatinine, Ser 0.96  0.50 - 1.10 mg/dL    Calcium 8.9  8.4 - 04.5 mg/dL    GFR calc non Af Amer 76 (*) >90 mL/min    GFR calc Af Amer 88 (*) >90 mL/min   HEMOGLOBIN A1C     Status: Normal   Collection Time   09/08/11  9:45 AM      Component Value Range Comment   Hemoglobin A1C 5.6  <5.7 %    Mean Plasma Glucose 114  <117 mg/dL   GLUCOSE, CAPILLARY     Status: Abnormal   Collection Time   09/08/11  9:43 PM      Component Value Range Comment   Glucose-Capillary 117 (*) 70 - 99 mg/dL    Comment 1 Notify RN        HEENT: normal Cardio: RRR Resp: CTA B/L GI: BS positive Extremity:  Pulses positive Skin:   Intact L scalp wound CDI Neuro: Confused, Cranial Nerve II-XII normal, Normal Sensory and Apraxic Musc/Skel:  Extremity tender R shoulder AC joint pain   Assessment/Plan: 1. Functional deficits secondary to L frontal SDH with cognitive , behaovior deficits and apraxia which require 3+ hours per day of interdisciplinary therapy in a comprehensive inpatient rehab setting. Physiatrist is providing close team supervision and 24 hour management of active medical problems listed below. Physiatrist and rehab team continue to assess barriers to  discharge/monitor patient progress toward functional and medical goals. FIM: FIM - Bathing Bathing Steps Patient Completed: Chest;Right Arm;Left Arm;Abdomen;Front perineal area;Buttocks;Right upper leg;Left upper leg;Left lower leg (including foot);Right lower leg (including foot) Bathing: 4: Steadying assist  FIM - Upper Body Dressing/Undressing Upper body dressing/undressing steps patient completed: Thread/unthread right bra strap;Thread/unthread left bra strap;Hook/unhook bra;Thread/unthread right sleeve of front closure shirt/dress;Thread/unthread left sleeve of front closure shirt/dress;Pull shirt around back of front closure shirt/dress;Button/unbutton shirt Upper body dressing/undressing: 5: Set-up assist to: Obtain clothing/put away FIM - Lower Body Dressing/Undressing Lower body dressing/undressing steps patient completed: Thread/unthread right underwear leg;Thread/unthread left underwear leg;Pull underwear up/down;Thread/unthread right pants leg;Thread/unthread left pants leg;Pull pants up/down;Don/Doff right sock Lower body dressing/undressing: 3: Mod-Patient completed 50-74% of tasks  FIM - Toileting Toileting steps completed by patient: Performs perineal hygiene;Adjust clothing after toileting Toileting Assistive Devices: Grab bar or rail for support Toileting: 3: Mod-Patient completed 2 of 3 steps  FIM - Diplomatic Services operational officer Devices: Elevated toilet seat Toilet Transfers: 4-To toilet/BSC: Min A (steadying Pt. > 75%);4-From toilet/BSC: Min A (steadying Pt. > 75%)  FIM - Bed/Chair Transfer Bed/Chair Transfer: 5: Supine > Sit: Supervision (verbal cues/safety issues);5: Sit > Supine: Supervision (verbal cues/safety issues);4: Bed > Chair or W/C: Min A (steadying Pt. > 75%);5: Chair or W/C > Bed: Supervision (verbal cues/safety issues)  FIM -  Locomotion: Wheelchair Distance: 150' Locomotion: Wheelchair: 4: Travels 150 ft or more: maneuvers on rugs and over  door sillls with minimal assistance (Pt.>75%) FIM - Locomotion: Ambulation Ambulation/Gait Assistance: 4: Min assist Locomotion: Ambulation: 4: Travels 150 ft or more with minimal assistance (Pt.>75%)  Comprehension Comprehension Mode: Auditory Comprehension: 5-Follows basic conversation/direction: With extra time/assistive device  Expression Expression Mode: Verbal Expression: 4-Expresses basic 75 - 89% of the time/requires cueing 10 - 24% of the time. Needs helper to occlude trach/needs to repeat words.  Social Interaction Social Interaction: 4-Interacts appropriately 75 - 89% of the time - Needs redirection for appropriate language or to initiate interaction.  Problem Solving Problem Solving: 3-Solves basic 50 - 74% of the time/requires cueing 25 - 49% of the time  Memory Memory: 3-Recognizes or recalls 50 - 74% of the time/requires cueing 25 - 49% of the time  Medical Problem List and Plan:  1. Bilateral chronic subdural hematoma. Status post left frontal burr hole 09/01/2011  2. DVT Prophylaxis/Anticoagulation: SCDs. Monitor for any signs of DVT  3. Mood. Monitor safety and check sleep chart. Patient will need a bed alarm for safety. Encouraged family to stay at night  4. Pain Management: Currently only on Tylenol for pain. Will monitor with increased activity  5. Neuropsych: This patient is not capable of making decisions on his/her own behalf.  6. Hypertension. Maxzide daily. Monitor for any signs of orthostasis Recheck BMET, will need K supp 7. Hypothyroidism. Synthroid 8.  R AC joint separation May 2013 with residual pain no precautions 9.  ? DM HgA1c normal, daily CBG, no tx LOS (Days) 3 A FACE TO FACE EVALUATION WAS PERFORMED  Jeryn Cerney E 09/09/2011, 6:22 AM

## 2011-09-09 NOTE — Progress Notes (Signed)
Speech Language Pathology Emily Session Note  Patient Details  Name: [name] MRN: [id number] Date of Birth: 05/01/1926  Today's Date: 09/09/2011 Time: 0900-0930 Time Calculation (min): 30 min  Short Term Goals: Week 1: SLP Short Term Goal 1 (Week 1): Pt will utilize call bell to express wants/needs witth Mod A question and semantic cues. SLP Short Term Goal 2 (Week 1): Pt will sustain attention to task for ~10 minutes with Min A cues for redirection SLP Short Term Goal 2 - Progress (Week 1): Progressing toward goal SLP Short Term Goal 3 (Week 1): Pt will demonstrate functional problem solving for basic and familiar tasks with Mod A verbald and question cues.  SLP Short Term Goal 4 (Week 1): Pt will utilize external memory aids to recall Emily information (schedule, calendar, etc) with Mod A verbal and visual cues. SLP Short Term Goal 4 - Progress (Week 1): Partly met (patient independently utilized wall calendar) SLP Short Term Goal 5 (Week 1): Pt will orient to person, place, situation and time with Mod A semantic and visual cues.  SLP Short Term Goal 6 (Week 1): Pt will consume regular textures and thin liquids without overt s/s of aspiration with supervision verbal cues  Skilled Therapeutic Interventions: Treatment focused on comprehension, problem solving, orientation and recall of recent events. Patient independently utilized wall calendar when asked what the date was, and was oriented to place, situation, and described that " i fell on the patio on the concrete". During comprehension and problem solving task, with SLP directing patient to use hospital dining menu to select preferred foods, patient required maximal verbal and visual cues to identify foods she likes. Patient had significant difficulty in completing verbal and functional problem solving tasks which required a specific response, and instead, she frequently gave generalized, vague responses, which she repeated, such as  "well, I had lunch, so this is not something for me to do now".    FIM:  Comprehension Comprehension Mode: Auditory Comprehension: 3-Understands basic 50 - 74% of the time/requires cueing 25 - 50%  of the time Expression Expression Mode: Verbal Expression: 3-Expresses basic 50 - 74% of the time/requires cueing 25 - 50% of the time. Needs to repeat parts of sentences. Social Interaction Social Interaction: 3-Interacts appropriately 50 - 74% of the time - May be physically or verbally inappropriate. Problem Solving Problem Solving: 3-Solves basic 50 - 74% of the time/requires cueing 25 - 49% of the time Memory Memory: 3-Recognizes or recalls 50 - 74% of the time/requires cueing 25 - 49% of the time  Pain Pain Assessment Pain Assessment: No/denies pain  Therapy/Group: Individual Therapy  Pablo Lawrence 09/09/2011, 2:25 PM  [name], MA, CCC-SLP [facility] Speech-Language Pathologist

## 2011-09-09 NOTE — Progress Notes (Signed)
Physical Therapy Session Note  Patient Details  Name: Joelie Schou MRN: [id number] Date of Birth: 1926-05-20  Today's Date: 09/09/2011 Time: 1515-1600 Time Calculation (min): 45 min  Short Term Goals: Week 1:  PT Short Term Goal 1 (Week 1): Pt will perform sit <> stands with supervision consistently.  PT Short Term Goal 2 (Week 1): Pt will ambulate 100' with min assist. PT Short Term Goal 3 (Week 1): Pt will perform dynamic standing balance activities with min assist.   Therapy Documentation Precautions:  Precautions Precautions: Fall Restrictions Weight Bearing Restrictions: No Pain: denies pain at rest but mild+ c/o pain in hips with weight bearing and with walking.   Therapeutic Exercise:(15') B LE's in sitting and Nu-Step Level 3 x 5 minutes Therapeutic Activity:(15') Transfers sit<->stand and w/c <-> bed with S/min-assist. Gait Training:(15') using Rollator x 150' S/Mod-I and 2 x 15' inside room with handheld assist   Therapy/Group: Individual Therapy  Rex Kras 09/09/2011, 3:21 PM

## 2011-09-09 NOTE — Progress Notes (Signed)
Physical Therapy Session Note  Patient Details  Name: Tyrell Brereton MRN: [id number] Date of Birth: 1926/04/03  Today's Date: 09/09/2011 Time: 1105-1200 Time Calculation (min): 55 min  Skilled Therapeutic Interventions/Progress Updates:    LE strengthening Group Therapy for increase in functional mobility: Supine and sitting,10-20 reps, A/ROM for majority of exercises with both LEs.   Therapy Documentation Precautions:  Precautions Precautions: Fall Restrictions Weight Bearing Restrictions: No Vital Signs: Therapy Vitals Temp: 97.7 F (36.5 C) Temp src: Oral Pulse Rate: 59  Resp: 18  BP: 165/62 mmHg Patient Position, if appropriate: Lying Oxygen Therapy SpO2: 98 % O2 Device: None (Room air) Pain:  Hips and Rt Shoulder, pt unable to rate or describe appropriately, RN notified, pt denied pain medicine.  See FIM for current functional status  Therapy/Group: Group Therapy   Hortencia Conradi, 09/09/2011, 5:30 PM

## 2011-09-10 ENCOUNTER — Inpatient Hospital Stay (HOSPITAL_COMMUNITY): Payer: Medicare Other | Admitting: Physical Therapy

## 2011-09-10 NOTE — Progress Notes (Signed)
Ambulated to bathroom with steadying assist. Staples intact to left scalp. Alert and oriented to person and place. Decreased safety awareness, using bed alarm to remind patient to call for assistance. Able to demonstrate call bell use, but has yet to call. Declined to take partials out for cleaning.[name] A

## 2011-09-10 NOTE — Progress Notes (Signed)
Patient ID: [name], female   DOB: 1926-12-21, 78 y.o.   MRN: [id number]   Subjective/Complaints: Did you know I was diabetic? Blood sugars normal thus far  Objective: Vital Signs: Blood pressure 165/62, pulse 59, temperature 97.7 F (36.5 C), temperature source Oral, resp. rate 18, weight 47.3 kg (104 lb 4.4 oz), SpO2 98.00%. No results found. Results for orders placed during the hospital encounter of 09/06/11 (from the past 72 hour(s))  BASIC METABOLIC PANEL     Status: Abnormal   Collection Time   09/08/11  6:45 AM      Component Value Range Comment   Sodium 144  135 - 145 mEq/L    Potassium 3.8  3.5 - 5.1 mEq/L    Chloride 106  96 - 112 mEq/L    CO2 31  19 - 32 mEq/L    Glucose, Bld 92  70 - 99 mg/dL    BUN 16  6 - 23 mg/dL    Creatinine, Ser 4.09  0.50 - 1.10 mg/dL    Calcium 9.1  8.4 - 81.1 mg/dL    GFR calc non Af Amer 75 (*) >90 mL/min    GFR calc Af Amer 87 (*) >90 mL/min   BASIC METABOLIC PANEL     Status: Abnormal   Collection Time   09/08/11  9:45 AM      Component Value Range Comment   Sodium 143  135 - 145 mEq/L    Potassium 3.4 (*) 3.5 - 5.1 mEq/L    Chloride 105  96 - 112 mEq/L    CO2 28  19 - 32 mEq/L    Glucose, Bld 98  70 - 99 mg/dL    BUN 15  6 - 23 mg/dL    Creatinine, Ser 9.14  0.50 - 1.10 mg/dL    Calcium 8.9  8.4 - 78.2 mg/dL    GFR calc non Af Amer 76 (*) >90 mL/min    GFR calc Af Amer 88 (*) >90 mL/min   HEMOGLOBIN A1C     Status: Normal   Collection Time   09/08/11  9:45 AM      Component Value Range Comment   Hemoglobin A1C 5.6  <5.7 %    Mean Plasma Glucose 114  <117 mg/dL   GLUCOSE, CAPILLARY     Status: Abnormal   Collection Time   09/08/11  9:43 PM      Component Value Range Comment   Glucose-Capillary 117 (*) 70 - 99 mg/dL    Comment 1 Notify RN     GLUCOSE, CAPILLARY     Status: Normal   Collection Time   09/09/11  7:29 AM      Component Value Range Comment   Glucose-Capillary 89  70 - 99 mg/dL   GLUCOSE, CAPILLARY     Status:  Abnormal   Collection Time   09/09/11  5:18 PM      Component Value Range Comment   Glucose-Capillary 147 (*) 70 - 99 mg/dL   GLUCOSE, CAPILLARY     Status: Abnormal   Collection Time   09/09/11  8:34 PM      Component Value Range Comment   Glucose-Capillary 143 (*) 70 - 99 mg/dL    Comment 1 Notify RN        HEENT: normal Cardio: RRR Resp: CTA B/L GI: BS positive Extremity:  Pulses positive Skin:   Intact L scalp wound CDI Neuro: Confused, Cranial Nerve II-XII normal, Normal Sensory and Apraxic Musc/Skel:  Extremity tender R shoulder AC joint pain   Assessment/Plan: 1. Functional deficits secondary to L frontal SDH with cognitive , behaovior deficits and apraxia which require 3+ hours per day of interdisciplinary therapy in a comprehensive inpatient rehab setting. Physiatrist is providing close team supervision and 24 hour management of active medical problems listed below. Physiatrist and rehab team continue to assess barriers to discharge/monitor patient progress toward functional and medical goals. FIM: FIM - Bathing Bathing Steps Patient Completed: Chest;Right Arm;Left Arm;Abdomen;Front perineal area;Buttocks;Right upper leg;Left upper leg;Left lower leg (including foot);Right lower leg (including foot) Bathing: 0: Activity did not occur  FIM - Upper Body Dressing/Undressing Upper body dressing/undressing steps patient completed: Thread/unthread right bra strap;Thread/unthread left bra strap;Hook/unhook bra;Thread/unthread right sleeve of front closure shirt/dress;Thread/unthread left sleeve of front closure shirt/dress;Pull shirt around back of front closure shirt/dress;Button/unbutton shirt Upper body dressing/undressing: 0: Activity did not occur FIM - Lower Body Dressing/Undressing Lower body dressing/undressing steps patient completed: Thread/unthread right underwear leg;Thread/unthread left underwear leg;Pull underwear up/down;Thread/unthread right pants  leg;Thread/unthread left pants leg;Pull pants up/down;Don/Doff right sock Lower body dressing/undressing: 0: Activity did not occur  FIM - Toileting Toileting steps completed by patient: Adjust clothing prior to toileting;Performs perineal hygiene;Adjust clothing after toileting Toileting Assistive Devices: Grab bar or rail for support Toileting: 3: Mod-Patient completed 2 of 3 steps  FIM - Diplomatic Services operational officer Devices: Elevated toilet seat;Grab bars Toilet Transfers: 4-To toilet/BSC: Min A (steadying Pt. > 75%);4-From toilet/BSC: Min A (steadying Pt. > 75%)  FIM - Bed/Chair Transfer Bed/Chair Transfer: 4: Bed > Chair or W/C: Min A (steadying Pt. > 75%);4: Chair or W/C > Bed: Min A (steadying Pt. > 75%)  FIM - Locomotion: Wheelchair Distance: 150' Locomotion: Wheelchair: 1: Total Assistance/staff pushes wheelchair (Pt<25%) FIM - Locomotion: Ambulation Ambulation/Gait Assistance: 4: Min assist Locomotion: Ambulation: 0: Activity did not occur  Comprehension Comprehension Mode: Auditory Comprehension: 3-Understands basic 50 - 74% of the time/requires cueing 25 - 50%  of the time  Expression Expression Mode: Verbal Expression: 3-Expresses basic 50 - 74% of the time/requires cueing 25 - 50% of the time. Needs to repeat parts of sentences.  Social Interaction Social Interaction: 3-Interacts appropriately 50 - 74% of the time - May be physically or verbally inappropriate.  Problem Solving Problem Solving: 3-Solves basic 50 - 74% of the time/requires cueing 25 - 49% of the time  Memory Memory: 3-Recognizes or recalls 50 - 74% of the time/requires cueing 25 - 49% of the time  Medical Problem List and Plan:  1. Bilateral chronic subdural hematoma. Status post left frontal burr hole 09/01/2011  2. DVT Prophylaxis/Anticoagulation: SCDs. Monitor for any signs of DVT  3. Mood. Monitor safety and check sleep chart. Patient will need a bed alarm for safety.  Encouraged family to stay at night  4. Pain Management: Currently only on Tylenol for pain. Will monitor with increased activity  5. Neuropsych: This patient is not capable of making decisions on his/her own behalf.  6. Hypertension. Maxzide daily. Monitor for any signs of orthostasis Recheck BMET, will need K supp 7. Hypothyroidism. Synthroid 8.  R AC joint separation May 2013 with residual pain no precautions 9.  ? DM HgA1c normal, daily CBG, no tx LOS (Days) 4 A FACE TO FACE EVALUATION WAS PERFORMED  Reyanna Baley E 09/10/2011, 6:59 AM

## 2011-09-10 NOTE — Progress Notes (Signed)
Physical Therapy Note  Patient Details  Name: [name] MRN: [id number] Date of Birth: [birth year] Today's Date: 09/10/2011  Time: 1000-1055 55 minutes  No c/o pain.  Pt with increased confusion upon PT arrival, required max cues to orient to situation, after oriented pt agreeable to participate in therapy session.  Household gait and balance training with min A around room with dressing, toileting, hygiene tasks with mod cues to organize thoughts, max cues for safety awareness.  Pt displays "furniture walking " tendencies throughout.  Gait training trial of rollator vs RW vs without AD.  Pt with improved balance with RW, continues to require min A with all gait methods at this time due to shuffling and increased risk of falls.  Mobility/memory tasks with pt performing searching for horseshoes in gym with good scanning both directions, total A to recall number of horseshoes she was to find.  Standing balance with working memory for naming objects, pt required max questioning cues to recall objects to be named 5-10 seconds later.  Attempted divided attention task with gait training, pt unable to perform divided attention with gait at this time.  Pt without LOB but requires min A for increased sway especially when fatigued.  Pt limited by poor awareness and disorganization of thoughts.  Individual therapy   Timberly Yott 09/10/2011, 10:53 AM

## 2011-09-11 ENCOUNTER — Inpatient Hospital Stay (HOSPITAL_COMMUNITY): Payer: Medicare Other | Admitting: Occupational Therapy

## 2011-09-11 ENCOUNTER — Inpatient Hospital Stay (HOSPITAL_COMMUNITY): Payer: Medicare Other | Admitting: Speech Pathology

## 2011-09-11 ENCOUNTER — Inpatient Hospital Stay (HOSPITAL_COMMUNITY): Payer: Medicare Other | Admitting: Physical Therapy

## 2011-09-11 DIAGNOSIS — I6200 Nontraumatic subdural hemorrhage, unspecified: Secondary | ICD-10-CM

## 2011-09-11 DIAGNOSIS — R279 Unspecified lack of coordination: Secondary | ICD-10-CM

## 2011-09-11 DIAGNOSIS — Z5189 Encounter for other specified aftercare: Secondary | ICD-10-CM

## 2011-09-11 LAB — GLUCOSE, CAPILLARY: Glucose-Capillary: 100 mg/dL — ABNORMAL HIGH (ref 70–99)

## 2011-09-11 NOTE — Progress Notes (Signed)
Physical Therapy Session Note  Patient Details  Name: [name] MRN: [id number] Date of Birth: [birth year]  Today's Date: 09/11/2011 Time: 4098-1191 Time Calculation (min): 55 min  Short Term Goals: Week 1:  PT Short Term Goal 1 (Week 1): Pt will perform sit <> stands with supervision consistently.  PT Short Term Goal 2 (Week 1): Pt will ambulate 100' with min assist. PT Short Term Goal 3 (Week 1): Pt will perform dynamic standing balance activities with min assist.  Skilled Therapeutic Interventions/Progress Updates:    Pt with extremely impaired memory and word finding difficulties throughout session. No family available today. Performed gait training without RW x 150', with RW x 100', 150', 50'. Pt requires full min assist without device, min-guard assist with RW. Worked on side stepping and backwards walking with RW in room, min-guard assist. Practiced steps x 4 reps with min assist, mod verbal cues for sequencing and safety. Standing balance activities working single limb stance combined with memory task. Standing balance activities on slightly compliant balance beam place perpendicular and parallel working on righting reactions with varied bases of support.   Second Session Skilled Therapeutic Interventions/Progress Updates:  Time:  [phone] Time Calculation (min):30  min Session focused on gait training with RW. >400' total with up to min assist to guide walker for safety at times. Pt has tendency to leave RW behind, have difficulty using in tight spaces, sometimes allows Bil. LEs to get outside of walker base. Pt needs more practice with RW, family will need more education. Gait over carpeted floor + dynamic balance watering plants practicing functional use with RW. Obstacle negotiation in room working working on [employer].   Therapy Documentation Precautions:  Precautions Precautions: Fall Restrictions Weight Bearing Restrictions: No Pain: Pain Assessment Pain  Assessment: No/denies pain both sessions  See FIM for current functional status  Therapy/Group: Individual Therapy both sessions [name] 09/11/2011, 12:06 PM

## 2011-09-11 NOTE — Progress Notes (Signed)
Occupational Therapy Session Note  Patient Details  Name: [name] MRN: [id number] Date of Birth: 1926-05-18  Today's Date: 09/11/2011 Time: 1030-1130 Time Calculation (min): 60 min  Short Term Goals: Week 1:  OT Short Term Goal 1 (Week 1): STG=LTG secondary to estimated SLOS  Skilled Therapeutic Interventions/Progress Updates:    1:1 self care retraining at shower level: focus on functional mobility/ ambulation, functional problem solving with mod A, awareness of deficits with total A, pt with perseveration with bathing parts and toothbrushing, task organization/ sequencing with mod A  Therapy Documentation Precautions:  Precautions Precautions: Fall Restrictions Weight Bearing Restrictions: No Pain: Pain Assessment Pain Assessment: No/denies pain  See FIM for current functional status  Therapy/Group: Individual Therapy  Roney Mans [facility] 09/11/2011, 12:32 PM

## 2011-09-11 NOTE — Progress Notes (Signed)
RN had very difficult time getting pt to take medications tonight. Pt repeatedly stated "I have already taken pills today". Could not get pt to understand pills are taken several times a day. Explained that doctor ordered medications to be given at certain times and nurses give the medications as ordered by the doctor. Pt unable to remember who Dr. Riley Kill is and also thought she was in a new place. Attempted to reorient pt and explain to her she in on the rehab unit where she has been for several days now doing therapy. Pt finally reluctantly agreed to take medications (potassium, senna, tylenol) after spending some time answering repetitive questions with her. Bed alarm on for safety, pt with one attempt out of bed to bathroom unassisted. C/o stomach discomfort, tylenol 650mg  given. Now resting comfortably. Continue to monitor. Mick Sell, RN

## 2011-09-11 NOTE — Progress Notes (Addendum)
Speech Language Pathology Daily Session Note  Patient Details  Name: [name] MRN: [id number] Date of Birth: [birth year]  Today's Date: 09/11/2011 Time: 0800-0900 Time Calculation (min): 60 min  Short Term Goals: Week 1: SLP Short Term Goal 1 (Week 1): Pt will utilize call bell to express wants/needs witth Mod A question and semantic cues. SLP Short Term Goal 2 (Week 1): Pt will sustain attention to task for ~10 minutes with Min A cues for redirection SLP Short Term Goal 2 - Progress (Week 1): Progressing toward goal SLP Short Term Goal 3 (Week 1): Pt will demonstrate functional problem solving for basic and familiar tasks with Mod A verbald and question cues.  SLP Short Term Goal 4 (Week 1): Pt will utilize external memory aids to recall daily information (schedule, calendar, etc) with Mod A verbal and visual cues. SLP Short Term Goal 4 - Progress (Week 1): Partly met (patient independently utilized wall calendar) SLP Short Term Goal 5 (Week 1): Pt will orient to person, place, situation and time with Mod A semantic and visual cues.  SLP Short Term Goal 6 (Week 1): Pt will consume regular textures and thin liquids without overt s/s of aspiration with supervision verbal cues  Skilled Therapeutic Interventions: Treatment focus on functional problem solving and working memory with utilization of compensatory strategies. Pt required total A visual cues to orient to place and Max A verbal and visual cues to orient to date and situation. Pt also required Mod A question cues to utilize schedule to recall daily events and total A for problem solving with reading schedule.    FIM:  Comprehension Comprehension Mode: Auditory Comprehension: 3-Understands basic 50 - 74% of the time/requires cueing 25 - 50%  of the time Expression Expression Mode: Verbal Expression: 3-Expresses basic 50 - 74% of the time/requires cueing 25 - 50% of the time. Needs to repeat parts of sentences. Social  Interaction Social Interaction: 3-Interacts appropriately 50 - 74% of the time - May be physically or verbally inappropriate. Problem Solving Problem Solving: 3-Solves basic 50 - 74% of the time/requires cueing 25 - 49% of the time Memory Memory: 2-Recognizes or recalls 25 - 49% of the time/requires cueing 51 - 75% of the time  Pain Pain Assessment Pain Assessment: No/denies pain  Therapy/Group: Individual Therapy  Dilan Novosad 09/11/2011, 3:58 PM

## 2011-09-11 NOTE — Progress Notes (Signed)
[facility] Individual Statement of Services  Patient Name:  [name]  Date:  09/11/2011  Welcome to the [facility].  Our goal is to provide you with an individualized program based on your diagnosis and situation, designed to meet your specific needs.  With this comprehensive rehabilitation program, you will be expected to participate in at least 3 hours of rehabilitation therapies Monday-Friday, with modified therapy programming on the weekends.  Your rehabilitation program will include the following services:  Physical Therapy (PT), Occupational Therapy (OT), Speech Therapy (ST), 24 hour per day rehabilitation nursing, Therapeutic Recreaction (TR), Neuropsychology, Case Management (Social Worker), Rehabilitation Medicine, Nutrition Services and Pharmacy Services  Weekly team conferences will be held on Tuesdays to discuss your progress.  Your  Social Worker will talk with you frequently to get your input and to update you on team discussions.  Team conferences with you and your family in attendance may also be held.  Expected length of stay: 2 weeks  Overall anticipated outcome: supervision to minimal assistance  Depending on your progress and recovery, your program may change.  Your  Social Worker will coordinate services and will keep you informed of any changes.  Your  Social Worker's name and contact numbers are listed  below.  The following services may also be recommended but are not provided by the [facility]:   Driving Evaluations  Home Health Rehabiltiation Services  Outpatient Rehabilitatation [facility]  Vocational Rehabilitation   Arrangements will be made to provide these services after discharge if needed.  Arrangements include referral to agencies that provide these services.  Your insurance has been verified to be:  Medicare and BCBS Your primary doctor is:  Dr. Kem Boroughs  Pertinent information will  be shared with your doctor and your insurance company.   Social Worker:  [place], [place] [phone] or ([phone]  Information discussed with and copy given to patient by: Amada Jupiter, 09/11/2011, 10:09 AM

## 2011-09-11 NOTE — Progress Notes (Signed)
Subjective/Complaints: No complaints. Denies pain. Pleasantly confused.  Objective: Vital Signs: Blood pressure 163/68, pulse 58, temperature 97.9 F (36.6 C), temperature source Oral, resp. rate 18, weight 47.3 kg (104 lb 4.4 oz), SpO2 96.00%. No results found. Results for orders placed during the hospital encounter of 09/06/11 (from the past 72 hour(s))  BASIC METABOLIC PANEL     Status: Abnormal   Collection Time   09/08/11  9:45 AM      Component Value Range Comment   Sodium 143  135 - 145 mEq/L    Potassium 3.4 (*) 3.5 - 5.1 mEq/L    Chloride 105  96 - 112 mEq/L    CO2 28  19 - 32 mEq/L    Glucose, Bld 98  70 - 99 mg/dL    BUN 15  6 - 23 mg/dL    Creatinine, Ser 1.61  0.50 - 1.10 mg/dL    Calcium 8.9  8.4 - 09.6 mg/dL    GFR calc non Af Amer 76 (*) >90 mL/min    GFR calc Af Amer 88 (*) >90 mL/min   HEMOGLOBIN A1C     Status: Normal   Collection Time   09/08/11  9:45 AM      Component Value Range Comment   Hemoglobin A1C 5.6  <5.7 %    Mean Plasma Glucose 114  <117 mg/dL   GLUCOSE, CAPILLARY     Status: Abnormal   Collection Time   09/08/11  9:43 PM      Component Value Range Comment   Glucose-Capillary 117 (*) 70 - 99 mg/dL    Comment 1 Notify RN     GLUCOSE, CAPILLARY     Status: Normal   Collection Time   09/09/11  7:29 AM      Component Value Range Comment   Glucose-Capillary 89  70 - 99 mg/dL   GLUCOSE, CAPILLARY     Status: Abnormal   Collection Time   09/09/11  5:18 PM      Component Value Range Comment   Glucose-Capillary 147 (*) 70 - 99 mg/dL   GLUCOSE, CAPILLARY     Status: Abnormal   Collection Time   09/09/11  8:34 PM      Component Value Range Comment   Glucose-Capillary 143 (*) 70 - 99 mg/dL    Comment 1 Notify RN     GLUCOSE, CAPILLARY     Status: Normal   Collection Time   09/10/11  7:24 AM      Component Value Range Comment   Glucose-Capillary 80  70 - 99 mg/dL   GLUCOSE, CAPILLARY     Status: Abnormal   Collection Time   09/10/11 11:58 AM      Component Value Range Comment   Glucose-Capillary 120 (*) 70 - 99 mg/dL   GLUCOSE, CAPILLARY     Status: Abnormal   Collection Time   09/10/11  8:46 PM      Component Value Range Comment   Glucose-Capillary 138 (*) 70 - 99 mg/dL    Comment 1 Notify RN        HEENT: normal Cardio: RRR Resp: CTA B/L GI: BS positive Extremity:  Pulses positive Skin:   Intact L scalp wound CDI with staples Neuro: Confused. Can tell me month and date with cues. Follows simple commands.  Cranial Nerve II-XII normal, Normal Sensory and Apraxic Musc/Skel:  Extremity tender R shoulder AC joint pain   Assessment/Plan: 1. Functional deficits secondary to L frontal SDH with cognitive ,  behaovior deficits and apraxia which require 3+ hours per day of interdisciplinary therapy in a comprehensive inpatient rehab setting. Physiatrist is providing close team supervision and 24 hour management of active medical problems listed below. Physiatrist and rehab team continue to assess barriers to discharge/monitor patient progress toward functional and medical goals. FIM: FIM - Bathing Bathing Steps Patient Completed: Chest;Right Arm;Left Arm;Abdomen;Front perineal area;Buttocks;Right upper leg;Left upper leg;Left lower leg (including foot);Right lower leg (including foot) Bathing: 0: Activity did not occur  FIM - Upper Body Dressing/Undressing Upper body dressing/undressing steps patient completed: Thread/unthread right bra strap;Thread/unthread left bra strap;Hook/unhook bra;Thread/unthread right sleeve of front closure shirt/dress;Thread/unthread left sleeve of front closure shirt/dress;Pull shirt around back of front closure shirt/dress;Button/unbutton shirt Upper body dressing/undressing: 0: Activity did not occur FIM - Lower Body Dressing/Undressing Lower body dressing/undressing steps patient completed: Thread/unthread right underwear leg;Thread/unthread left underwear leg;Pull underwear up/down;Thread/unthread  right pants leg;Thread/unthread left pants leg;Pull pants up/down;Don/Doff right sock Lower body dressing/undressing: 0: Wears gown/pajamas-no public clothing  FIM - Toileting Toileting steps completed by patient: Adjust clothing prior to toileting;Performs perineal hygiene;Adjust clothing after toileting Toileting Assistive Devices: Grab bar or rail for support Toileting: 3: Mod-Patient completed 2 of 3 steps  FIM - Diplomatic Services operational officer Devices: Elevated toilet seat;Grab bars Toilet Transfers: 4-From toilet/BSC: Min A (steadying Pt. > 75%);4-To toilet/BSC: Min A (steadying Pt. > 75%)  FIM - Bed/Chair Transfer Bed/Chair Transfer: 4: Bed > Chair or W/C: Min A (steadying Pt. > 75%);4: Chair or W/C > Bed: Min A (steadying Pt. > 75%)  FIM - Locomotion: Wheelchair Distance: 150' Locomotion: Wheelchair: 1: Total Assistance/staff pushes wheelchair (Pt<25%) FIM - Locomotion: Ambulation Ambulation/Gait Assistance: 4: Min assist Locomotion: Ambulation: 4: Travels 150 ft or more with minimal assistance (Pt.>75%)  Comprehension Comprehension Mode: Auditory Comprehension: 3-Understands basic 50 - 74% of the time/requires cueing 25 - 50%  of the time  Expression Expression Mode: Verbal Expression: 3-Expresses basic 50 - 74% of the time/requires cueing 25 - 50% of the time. Needs to repeat parts of sentences.  Social Interaction Social Interaction: 3-Interacts appropriately 50 - 74% of the time - May be physically or verbally inappropriate.  Problem Solving Problem Solving: 3-Solves basic 50 - 74% of the time/requires cueing 25 - 49% of the time  Memory Memory: 3-Recognizes or recalls 50 - 74% of the time/requires cueing 25 - 49% of the time  Medical Problem List and Plan:  1. Bilateral chronic subdural hematoma. Status post left frontal burr hole 09/01/2011  2. DVT Prophylaxis/Anticoagulation: SCDs. Monitor for any signs of DVT  3. Mood. Monitor safety and check  sleep chart. Patient will need a bed alarm for safety. Encouraged family to stay at night  4. Pain Management: Currently only on Tylenol for pain. Will monitor with increased activity  5. Neuropsych: This patient is not capable of making decisions on his/her own behalf.  6. Hypertension. Maxzide daily. Monitor for any signs of orthostasis Recheck BMET,   K supp 7. Hypothyroidism. Synthroid 8.  R AC joint separation May 2013 with residual pain no precautions 9.  ? Borderline DM HgA1c normal, daily CBG are normal to occasionally borderline. LOS (Days) 5 A FACE TO FACE EVALUATION WAS PERFORMED  SWARTZ,ZACHARY T 09/11/2011, 8:09 AM

## 2011-09-11 NOTE — Progress Notes (Signed)
Social Work Assessment and Plan Social Work Assessment and Plan  Patient Details  Name: [name] MRN: [id number] Date of Birth: 11-04-1926  Today's Date: 09/11/2011  Problem List:  Patient Active Problem List  Diagnosis  . PREMATURE VENTRICULAR CONTRACTIONS  . PALPITATIONS  . SDH (subdural hematoma)   Past Medical History:  Past Medical History  Diagnosis Date  . Subdural hematoma   . Hypertension   . Mitral valvular prolapse   . CVA (cerebrovascular accident)    Past Surgical History:  Past Surgical History  Procedure Date  . Ines Bloomer hole 09/01/2011    Procedure: Ezekiel Ina;  Surgeon: Tia Alert, MD;  Location: [facility];  Service: Neurosurgery;  Laterality: Left;  Left Ezekiel Ina    Social History:  reports that she has never smoked. She has never used smokeless tobacco. She reports that she does not drink alcohol or use illicit drugs.  Family / Support Systems Marital Status: Widow/Widower How Long?: 10 years Patient Roles: Parent Children: daughter, Pricilla Riffle @ [phone]; Tammy Lopp @ (C) [phone] and Driscilla Grammes - all three living in [place] Anticipated Caregiver: Daughters (Plan to hire caregivers) Ability/Limitations of Caregiver: none Caregiver Availability: 24/7 (Daughters can provide some assist and will hire caregivers.) Family Dynamics: Daughters are all very supportive and plan to arrange any assistance needed  Social History Preferred language: English Religion:  Cultural Background: NA Education: HS Read: Yes Write: Yes Employment Status: Retired Date Retired/Disabled/Unemployed: Building surveyor Issues: None Guardian/Conservator: None   Abuse/Neglect Physical Abuse: Denies Verbal Abuse: Denies Sexual Abuse: Denies Exploitation of patient/patient's resources: Denies Self-Neglect: Denies  Emotional Status Pt's affect, behavior adn adjustment status: Elderly woman lying in bed with daughter at bedside -  soft spoken and occasionally offers incorrect answers with daughter correcting. Denies any significant emotional distress and no s/s of depression or anxiety.  No formal depression screen attempted at this time, however, will monitor status throughout stay. Recent Psychosocial Issues: None Pyschiatric History: None Substance Abuse History: None  Patient / Family Perceptions, Expectations & Goals Pt/Family understanding of illness & functional limitations: Pt with very basic understanding that she has had a slow hemorrhage in her brain and of her current functional deficits.  Family also with general understanding and of need for 24/7 supervision/ assistance anticipated at d/c Premorbid pt/family roles/activities: Pt was living alone until a few months ago when daughters had started nighttime caregivers due to decreasing cognition.  Had managed her household independently. Anticipated changes in roles/activities/participation: Pt will now require an increase to 24/ 7 caregivers and assistance with home management, meds, etc. Pt/family expectations/goals: Pt focused on getting home ASAP.  Daughters hoping for supervision level.  Community Resources [employer]: None Premorbid Home Care/DME Agencies: None Transportation available at discharge: yes Resource referrals recommended: Neuropsychology  Discharge Planning Living Arrangements: Non-relatives/Friends Support Systems: Children Type of Residence: Private residence Insurance Resources: Administrator (specify) Herbalist) Financial Resources: Restaurant manager, fast food Screen Referred: No Living Expenses: Own Money Management: Family Do you have any problems obtaining your medications?: No Home Management: pt, family, caregiver Patient/Family Preliminary Plans: To d/c to her own home and family hiring private caregivers Social Work Anticipated Follow Up Needs: HH/OP Expected length of stay: 2 weeks  Clinical  Impression Pleasant, elderly woman here after chronic SDH.  Daughter at bedside and very attentive.  Reports family arranging 24/7 assistance for pt at her home.  No s/s of any significant emotional distress, however, will monitor throughout stay.  Louanna Vanliew 09/11/2011, 9:57 AM

## 2011-09-12 ENCOUNTER — Inpatient Hospital Stay (HOSPITAL_COMMUNITY): Payer: Medicare Other | Admitting: Occupational Therapy

## 2011-09-12 ENCOUNTER — Inpatient Hospital Stay (HOSPITAL_COMMUNITY): Payer: Medicare Other | Admitting: Physical Therapy

## 2011-09-12 ENCOUNTER — Inpatient Hospital Stay (HOSPITAL_COMMUNITY): Payer: Medicare Other | Admitting: Speech Pathology

## 2011-09-12 LAB — GLUCOSE, CAPILLARY: Glucose-Capillary: 134 mg/dL — ABNORMAL HIGH (ref 70–99)

## 2011-09-12 NOTE — Plan of Care (Signed)
Problem: RH SAFETY Goal: RH OTHER STG SAFETY GOALS W/ASSIST Other STG Safety Goals With Assistance.  Outcome: Not Progressing Educate the patient to press the call light every time she needs help.Patient still trying to get out of the bed without assistance. Bed alarm in place and functioning.

## 2011-09-12 NOTE — Plan of Care (Signed)
Problem: RH SAFETY Goal: RH STG ADHERE TO SAFETY PRECAUTIONS W/ASSISTANCE/DEVICE STG Adhere to Safety Precautions With Min Assistance/Device.  Outcome: Not Progressing Educate the patient to press the call light every time she needs help. Patient still  trying to get out of the bed without assistance. Bed alarm in place and functioning.

## 2011-09-12 NOTE — Progress Notes (Signed)
Subjective/Complaints: No new problems reported. Denies pain. Pleasantly confused.  Objective: Vital Signs: Blood pressure 144/78, pulse 58, temperature 98.4 F (36.9 C), temperature source Oral, resp. rate 18, weight 47.3 kg (104 lb 4.4 oz), SpO2 97.00%. No results found. Results for orders placed during the hospital encounter of 09/06/11 (from the past 72 hour(s))  GLUCOSE, CAPILLARY     Status: Abnormal   Collection Time   09/09/11  5:18 PM      Component Value Range Comment   Glucose-Capillary 147 (*) 70 - 99 mg/dL   GLUCOSE, CAPILLARY     Status: Abnormal   Collection Time   09/09/11  8:34 PM      Component Value Range Comment   Glucose-Capillary 143 (*) 70 - 99 mg/dL    Comment 1 Notify RN     GLUCOSE, CAPILLARY     Status: Normal   Collection Time   09/10/11  7:24 AM      Component Value Range Comment   Glucose-Capillary 80  70 - 99 mg/dL   GLUCOSE, CAPILLARY     Status: Abnormal   Collection Time   09/10/11 11:58 AM      Component Value Range Comment   Glucose-Capillary 120 (*) 70 - 99 mg/dL   GLUCOSE, CAPILLARY     Status: Abnormal   Collection Time   09/10/11  8:46 PM      Component Value Range Comment   Glucose-Capillary 138 (*) 70 - 99 mg/dL    Comment 1 Notify RN     GLUCOSE, CAPILLARY     Status: Abnormal   Collection Time   09/11/11  9:19 PM      Component Value Range Comment   Glucose-Capillary 100 (*) 70 - 99 mg/dL    Comment 1 Notify RN        HEENT: normal Cardio: RRR Resp: CTA B/L GI: BS positive Extremity:  Pulses positive Skin:   Intact L scalp wound CDI with staples Neuro: Confused. Can tell me month and date with cues. Follows simple commands.  Cranial Nerve II-XII normal, Normal Sensory and Apraxic Musc/Skel:  Extremity tender R shoulder AC joint pain   Assessment/Plan: 1. Functional deficits secondary to L frontal SDH with cognitive , behaovior deficits and apraxia which require 3+ hours per day of interdisciplinary therapy in a  comprehensive inpatient rehab setting. Physiatrist is providing close team supervision and 24 hour management of active medical problems listed below. Physiatrist and rehab team continue to assess barriers to discharge/monitor patient progress toward functional and medical goals. FIM: FIM - Bathing Bathing Steps Patient Completed: Chest;Right Arm;Left Arm;Abdomen;Front perineal area;Buttocks;Right upper leg;Left upper leg;Left lower leg (including foot);Right lower leg (including foot) Bathing: 0: Activity did not occur  FIM - Upper Body Dressing/Undressing Upper body dressing/undressing steps patient completed: Thread/unthread right bra strap;Thread/unthread left bra strap;Hook/unhook bra;Thread/unthread right sleeve of front closure shirt/dress;Thread/unthread left sleeve of front closure shirt/dress;Pull shirt around back of front closure shirt/dress;Button/unbutton shirt Upper body dressing/undressing: 0: Activity did not occur FIM - Lower Body Dressing/Undressing Lower body dressing/undressing steps patient completed: Thread/unthread right underwear leg;Thread/unthread left underwear leg;Pull underwear up/down;Thread/unthread right pants leg;Thread/unthread left pants leg;Pull pants up/down;Don/Doff right sock Lower body dressing/undressing: 0: Wears gown/pajamas-no public clothing  FIM - Toileting Toileting steps completed by patient: Adjust clothing prior to toileting;Performs perineal hygiene;Adjust clothing after toileting Toileting Assistive Devices: Grab bar or rail for support Toileting: 4: Steadying assist  FIM - Diplomatic Services operational officer Devices: Elevated toilet seat;Grab bars Toilet  Transfers: 4-From toilet/BSC: Min A (steadying Pt. > 75%)  FIM - Bed/Chair Transfer Bed/Chair Transfer: 5: Supine > Sit: Supervision (verbal cues/safety issues);5: Sit > Supine: Supervision (verbal cues/safety issues);5: Bed > Chair or W/C: Supervision (verbal cues/safety  issues);4: Chair or W/C > Bed: Min A (steadying Pt. > 75%)  FIM - Locomotion: Wheelchair Distance: 150' Locomotion: Wheelchair: 1: Total Assistance/staff pushes wheelchair (Pt<25%) FIM - Locomotion: Ambulation Locomotion: Ambulation Assistive Devices: Designer, industrial/product Ambulation/Gait Assistance: 4: Min guard Locomotion: Ambulation: 4: Travels 150 ft or more with minimal assistance (Pt.>75%)  Comprehension Comprehension Mode: Auditory Comprehension: 2-Understands basic 25 - 49% of the time/requires cueing 51 - 75% of the time  Expression Expression Mode: Verbal Expression: 3-Expresses basic 50 - 74% of the time/requires cueing 25 - 50% of the time. Needs to repeat parts of sentences.  Social Interaction Social Interaction: 2-Interacts appropriately 25 - 49% of time - Needs frequent redirection.  Problem Solving Problem Solving: 2-Solves basic 25 - 49% of the time - needs direction more than half the time to initiate, plan or complete simple activities  Memory Memory: 2-Recognizes or recalls 25 - 49% of the time/requires cueing 51 - 75% of the time  Medical Problem List and Plan:  1. Bilateral chronic subdural hematoma. Status post left frontal burr hole 09/01/2011  2. DVT Prophylaxis/Anticoagulation: SCDs. Monitor for any signs of DVT  3. Mood. Monitor safety and check sleep chart. Patient will need a bed alarm for safety. Encouraged family to stay at night  4. Pain Management: Currently only on Tylenol for pain. Will monitor with increased activity  5. Neuropsych: This patient is not capable of making decisions on his/her own behalf.  6. Hypertension. Maxzide daily. Monitor for any signs of orthostasis Recheck BMET on Thursday.   K supp 7. Hypothyroidism. Synthroid 8.  R AC joint separation May 2013 with residual pain no precautions 9.  ? Borderline DM HgA1c normal, daily CBG are normal to occasionally borderline. LOS (Days) 6 A FACE TO FACE EVALUATION WAS  PERFORMED  SWARTZ,ZACHARY T 09/12/2011, 8:08 AM

## 2011-09-12 NOTE — Progress Notes (Signed)
Occupational Therapy Session Note  Patient Details  Name: Taziah Difatta MRN: [id number] Date of Birth: March 08, 1926  Today's Date: 09/12/2011 Time: 0730-0815 Time Calculation (min): 45 min  Short Term Goals: Week 1:  OT Short Term Goal 1 (Week 1): STG=LTG secondary to estimated SLOS  Skilled Therapeutic Interventions/Progress Updates:    c/o headache after shower- RN made aware and gave meds 1:1 self care retraining at shower level focusing on functional problem solving, functional ambulation with RW with mod cuing to keep RW with her, cuing for sequencing and task organization. Pt with increased word finding difficulties, needing extra time/support, and cuing for making simple decisions, generating thoughts.   1:1 2nd session: 1030-1115 cognitive rehabilitation: paper activity to address visual scanning skills, working memory, sustained attention, generation of thoughts: with completing list of 3 concrete items with a similar item and activity of sequencing 4 steps to a functional tasks. Each task required excessive amount of time and mod to max cuing for each problem.   Therapy Documentation Precautions:  Precautions Precautions: Fall Restrictions Weight Bearing Restrictions: No Pain: Pain Assessment Pain Score: 0-No pain Pain Type: Acute pain Pain Location: Head Pain Descriptors: Headache Pain Frequency: Occasional Pain Intervention(s): Medication (See eMAR)  See FIM for current functional status  Therapy/Group: Individual Therapy  Roney Mans [facility] 09/12/2011, 11:58 AM

## 2011-09-12 NOTE — Progress Notes (Signed)
Speech Language Pathology Daily Session Note  Patient Details  Name: [name] MRN: [id number] Date of Birth: 06-03-1926  Today's Date: 09/12/2011 Time: 0815-0900 Time Calculation (min): 45 min  Short Term Goals: Week 1: SLP Short Term Goal 1 (Week 1): Pt will utilize call bell to express wants/needs witth Mod A question and semantic cues. SLP Short Term Goal 2 (Week 1): Pt will sustain attention to task for ~10 minutes with Min A cues for redirection SLP Short Term Goal 2 - Progress (Week 1): Progressing toward goal SLP Short Term Goal 3 (Week 1): Pt will demonstrate functional problem solving for basic and familiar tasks with Mod A verbald and question cues.  SLP Short Term Goal 4 (Week 1): Pt will utilize external memory aids to recall daily information (schedule, calendar, etc) with Mod A verbal and visual cues. SLP Short Term Goal 4 - Progress (Week 1): Partly met (patient independently utilized wall calendar) SLP Short Term Goal 5 (Week 1): Pt will orient to person, place, situation and time with Mod A semantic and visual cues.  SLP Short Term Goal 6 (Week 1): Pt will consume regular textures and thin liquids without overt s/s of aspiration with supervision verbal cues  Skilled Therapeutic Interventions: Treatment focus on selective attention, functional problem solving and working memory. Pt able to demonstrate divided attention between meal and functional conversation for ~ 1 minute with Min A verbal cues for redirection. Pt oriented to place with extra time and oriented to time and situation with Max A verbal cues for utilization of external memory aids. Pt also required Mod A verbal, semantic and question cues for problem solving and recall with utilization of schedule.    FIM:  Comprehension Comprehension Mode: Auditory Comprehension: 3-Understands basic 50 - 74% of the time/requires cueing 25 - 50%  of the time Expression Expression Mode: Verbal Expression: 3-Expresses  basic 50 - 74% of the time/requires cueing 25 - 50% of the time. Needs to repeat parts of sentences. Social Interaction Social Interaction: 4-Interacts appropriately 75 - 89% of the time - Needs redirection for appropriate language or to initiate interaction. Problem Solving Problem Solving: 3-Solves basic 50 - 74% of the time/requires cueing 25 - 49% of the time Memory Memory: 2-Recognizes or recalls 25 - 49% of the time/requires cueing 51 - 75% of the time  Pain Pain Assessment Pain Assessment: No/denies pain  Therapy/Group: Individual Therapy  Reece Mcbroom 09/12/2011, 2:31 PM

## 2011-09-12 NOTE — Progress Notes (Signed)
Physical Therapy Session Note  Patient Details  Name: [name] MRN: [id number] Date of Birth: Oct 20, 1926  Today's Date: 09/12/2011 Time: 1445-1540 Time Calculation (min): 55 min  Short Term Goals: Week 1:  PT Short Term Goal 1 (Week 1): Pt will perform sit <> stands with supervision consistently.  PT Short Term Goal 2 (Week 1): Pt will ambulate 100' with min assist. PT Short Term Goal 3 (Week 1): Pt will perform dynamic standing balance activities with min assist.  Skilled Therapeutic Interventions/Progress Updates:    Session focused on memory tasks, improving dynamic balance, improving safe functional use with RW.  - Gait x 200', 200', 50'  Controlled environment with supervision. - Gait in home environment x 100' total with min assist for safe RW use. Practiced backwards walking and side stepping on carpet with min assist, max verbal cues progressing to min.  - Dyanmic balance task with cone tapping without UE support, min/mod assist.  - Obstacle course practicing turning and obstacle negotiation safely with RW. - Curb step with RW performed 4x with min assist for safety, mod/max verbal cues for safety.  *Pt has minimal to no carry over for safety cues such as safe hand placements with sit <> stand although performed repeatedly throughout session.  Will absolutely need 24 hour supervision.  Therapy Documentation Precautions:  Precautions Precautions: Fall Restrictions Weight Bearing Restrictions: No Pain: Pain Assessment Pain Assessment: No/denies pain Locomotion : Ambulation Ambulation/Gait Assistance: 4: Min guard   See FIM for current functional status  Therapy/Group: Individual Therapy  [name] 09/12/2011, 5:03 PM

## 2011-09-13 ENCOUNTER — Encounter (HOSPITAL_COMMUNITY): Payer: BLUE CROSS/BLUE SHIELD

## 2011-09-13 ENCOUNTER — Inpatient Hospital Stay (HOSPITAL_COMMUNITY): Payer: Medicare Other | Admitting: Speech Pathology

## 2011-09-13 ENCOUNTER — Inpatient Hospital Stay (HOSPITAL_COMMUNITY): Payer: Medicare Other | Admitting: Physical Therapy

## 2011-09-13 ENCOUNTER — Inpatient Hospital Stay (HOSPITAL_COMMUNITY): Payer: Medicare Other | Admitting: Occupational Therapy

## 2011-09-13 DIAGNOSIS — I6200 Nontraumatic subdural hemorrhage, unspecified: Secondary | ICD-10-CM

## 2011-09-13 LAB — BASIC METABOLIC PANEL
CO2: 27 mEq/L (ref 19–32)
Chloride: 106 mEq/L (ref 96–112)
GFR calc Af Amer: 90 mL/min — ABNORMAL LOW (ref >90)
Potassium: 3.8 mEq/L (ref 3.5–5.1)
Sodium: 141 mEq/L (ref 135–145)

## 2011-09-13 NOTE — Progress Notes (Signed)
Social Work Patient ID: [name], female   DOB: [birth year], 78 y.o.   MRN: [id number]  Met with patient and daughter this am to review team conference.  Both in agreement with targeted d/c 8/23 at supervision to minimal assistance goals.  Daughter confirms family has hired someone to live-in with patient and provide 24/7 care.  Daughter plans to be here tomorrow a.m. For family education.   Sabreen Kitchen

## 2011-09-13 NOTE — Progress Notes (Signed)
Recreational Therapy Session Note  Patient Details  Name: [name] MRN: [id number] Date of Birth: 01/21/1927 Today's Date: 09/13/2011 Time:  835-905  Pain: no c/o Skilled Therapeutic Interventions/Progress Updates: Pt performed stand pivot transfer with min assist and short distance ambulation with HHA.  Unable to locate sheet music and stated that she couldn't remember how to play songs without music, but given the extra time for problem solving could play 2 songs from memory.  Pt required min verbal cues for selective attention.  Therapy/Group: Co-Treatment  Adante Courington 09/13/2011, 4:29 PM

## 2011-09-13 NOTE — Progress Notes (Signed)
Occupational Therapy Session Note  Patient Details  Name: [name] MRN: [id number] Date of Birth: [birth year]  Today's Date: 09/13/2011 Time: 4098-1191 Time Calculation (min): 45 min  Short Term Goals: Week 1:  OT Short Term Goal 1 (Week 1): STG=LTG secondary to estimated SLOS  Skilled Therapeutic Interventions/Progress Updates:    1:1 Functional task in the kitchen making "Pasta Sides" on the stovetop with focus on visual attention, visual scanning, working memory, expression of wants, making choices and decisions, reading comprehension, following one step directions. Needed max A to read and follow directions on the package, functional mobility in the kitchen without RW (in galley kitchen), simple problem solving. Went into ADL apartment to discuss bathroom setup and options for bathroom DME- will continue to plan to discuss with daughter during tomorrow's family education session.  Therapy Documentation Precautions:  Precautions Precautions: Fall Restrictions Weight Bearing Restrictions: No Pain: Pain Assessment Pain Assessment: No/denies pain Pain Score: 0-No pain  See FIM for current functional status  Therapy/Group: Individual Therapy  Roney Mans [facility] 09/13/2011, 11:23 AM

## 2011-09-13 NOTE — Patient Care Conference (Signed)
Inpatient RehabilitationTeam Conference Note Date: 09/12/2011   Time: 3:10 PM    Patient Name: [name]      Medical Record Number: [id number]  Date of Birth: Oct 10, 1926 Sex: Female         Room/Bed: 4004/4004-01 Payor Info: Payor: MEDICARE  Plan: MEDICARE PART A AND B  Product Type: *No Product type*     Admitting Diagnosis: Bilat SDH   Admit Date/Time:  09/06/2011  5:54 PM Admission Comments: No comment available   Primary Diagnosis:  SDH (subdural hematoma) Principal Problem: SDH (subdural hematoma)  Patient Active Problem List   Diagnosis Date Noted  . SDH (subdural hematoma) 09/07/2011  . PALPITATIONS 10/20/2008  . PREMATURE VENTRICULAR CONTRACTIONS 10/07/2008    Expected Discharge Date: Expected Discharge Date: 09/15/11  Team Members Present: Physician: Dr. Faith Rogue Social Worker Present: Amada Jupiter, LCSW Nurse Present: Other (comment) Kennon Portela, RN) PT Present: Reggy Eye, PT OT Present: Ardis Rowan, COTA;Kris Gellert, OT;Jennifer Katrinka Blazing, OT SLP Present: Feliberto Gottron, SLP     Current Status/Progress Goal Weekly Team Focus  Medical   acute on chronic SDH, confusion  management of somatic complaints.   compensate for cognitive deficits   Bowel/Bladder   continent of bowel and bladder  with toileting every 3 hours and prn   min assist  up to bathroom for all toileting   Swallow/Nutrition/ Hydration             ADL's   steady A  supervision  orientation, safety, family education for need for 24 hr supervision   Mobility   Min-guard/min assist with RW  Supervision  Improved balance and safety with RW. Improve functional memory and safety awareness/safety habits.   Communication   Max A  Min A  utilization of call bell to express wants/needs    Safety/Cognition/ Behavioral Observations  Mod-Max A  Min A  utilization of visual aids to increase working and short-term memory, functional problem solving   Pain   right shoulder pain tylenol given  at times   less than 3   monitor right shoulder pain    Skin   left arm bruising noted resolving  staples d/c'd from left scalp   no new breakdown   monitor skin       *See Interdisciplinary Assessment and Plan and progress notes for long and short-term goals  Barriers to Discharge: safety, poor awareness in general    Possible Resolutions to Barriers:  full supervision    Discharge Planning/Teaching Needs:  home with private duty, live - in caregiver and [facility] f/u therapy      Team Discussion:  Doing well overall functionally, however, cognition remains very impaired.  Meeting goals and plan to have family education 8/22  Revisions to Treatment Plan:  None   Continued Need for Acute Rehabilitation Level of Care: The patient requires daily medical management by a physician with specialized training in physical medicine and rehabilitation for the following conditions: Daily direction of a multidisciplinary physical rehabilitation program to ensure safe treatment while eliciting the highest outcome that is of practical value to the patient.: Yes Daily medical management of patient stability for increased activity during participation in an intensive rehabilitation regime.: Yes Daily analysis of laboratory values and/or radiology reports with any subsequent need for medication adjustment of medical intervention for : Neurological problems;Other  Raylon Lamson 09/13/2011, 6:05 PM

## 2011-09-13 NOTE — Progress Notes (Signed)
Subjective/Complaints: Awake and alert. Denies pain. Pleasantly confused.  Objective: Vital Signs: Blood pressure 168/78, pulse 73, temperature 98.2 F (36.8 C), temperature source Oral, resp. rate 17, weight 47.3 kg (104 lb 4.4 oz), SpO2 98.00%. No results found. Results for orders placed during the hospital encounter of 09/06/11 (from the past 72 hour(s))  GLUCOSE, CAPILLARY     Status: Abnormal   Collection Time   09/10/11 11:58 AM      Component Value Range Comment   Glucose-Capillary 120 (*) 70 - 99 mg/dL   GLUCOSE, CAPILLARY     Status: Abnormal   Collection Time   09/10/11  8:46 PM      Component Value Range Comment   Glucose-Capillary 138 (*) 70 - 99 mg/dL    Comment 1 Notify RN     GLUCOSE, CAPILLARY     Status: Abnormal   Collection Time   09/11/11  9:19 PM      Component Value Range Comment   Glucose-Capillary 100 (*) 70 - 99 mg/dL    Comment 1 Notify RN     GLUCOSE, CAPILLARY     Status: Abnormal   Collection Time   09/12/11  9:10 PM      Component Value Range Comment   Glucose-Capillary 134 (*) 70 - 99 mg/dL    Comment 1 Notify RN     BASIC METABOLIC PANEL     Status: Abnormal   Collection Time   09/13/11  7:03 AM      Component Value Range Comment   Sodium 141  135 - 145 mEq/L    Potassium 3.8  3.5 - 5.1 mEq/L    Chloride 106  96 - 112 mEq/L    CO2 27  19 - 32 mEq/L    Glucose, Bld 88  70 - 99 mg/dL    BUN 15  6 - 23 mg/dL    Creatinine, Ser 0.98  0.50 - 1.10 mg/dL    Calcium 9.3  8.4 - 11.9 mg/dL    GFR calc non Af Amer 78 (*) >90 mL/min    GFR calc Af Amer 90 (*) >90 mL/min      HEENT: normal Cardio: RRR Resp: CTA B/L GI: BS positive Extremity:  Pulses positive Skin:   Intact L scalp wound CDI with staples Neuro: Confused. Can tell me month and date with cues. Follows simple commands.  Cranial Nerve II-XII normal, Normal Sensory and Apraxic Musc/Skel:  Extremity tender R shoulder AC joint pain   Assessment/Plan: 1. Functional deficits  secondary to L frontal SDH with cognitive , behaovior deficits and apraxia which require 3+ hours per day of interdisciplinary therapy in a comprehensive inpatient rehab setting. Physiatrist is providing close team supervision and 24 hour management of active medical problems listed below. Physiatrist and rehab team continue to assess barriers to discharge/monitor patient progress toward functional and medical goals.  Pt approaching baseline from a cognitive standpoint?  FIM: FIM - Bathing Bathing Steps Patient Completed: Chest;Right lower leg (including foot);Right Arm;Left Arm;Abdomen;Front perineal area;Left lower leg (including foot);Buttocks;Right upper leg;Left upper leg Bathing: 5: Set-up assist to: Adjust water temp  FIM - Upper Body Dressing/Undressing Upper body dressing/undressing steps patient completed: Thread/unthread right bra strap;Thread/unthread left bra strap;Hook/unhook bra;Thread/unthread right sleeve of front closure shirt/dress;Thread/unthread left sleeve of front closure shirt/dress;Pull shirt around back of front closure shirt/dress;Button/unbutton shirt Upper body dressing/undressing: 5: Set-up assist to: Obtain clothing/put away FIM - Lower Body Dressing/Undressing Lower body dressing/undressing steps patient completed: Thread/unthread right underwear  leg;Thread/unthread left underwear leg;Thread/unthread right pants leg;Thread/unthread left pants leg;Pull underwear up/down;Pull pants up/down;Don/Doff right shoe;Don/Doff left sock;Don/Doff right sock;Don/Doff left shoe Lower body dressing/undressing: 5: Set-up assist to: Obtain clothing  FIM - Toileting Toileting steps completed by patient: Adjust clothing prior to toileting;Performs perineal hygiene;Adjust clothing after toileting Toileting Assistive Devices: Grab bar or rail for support Toileting: 5: Supervision: Safety issues/verbal cues  FIM - Diplomatic Services operational officer Devices: Elevated toilet  seat;Grab bars Toilet Transfers: 5-Set-up assist to: Apply orthosis/W/C setup;5-From toilet/BSC: Supervision (verbal cues/safety issues)  FIM - Games developer Transfer: 5: Supine > Sit: Supervision (verbal cues/safety issues);5: Bed > Chair or W/C: Supervision (verbal cues/safety issues);5: Chair or W/C > Bed: Supervision (verbal cues/safety issues)  FIM - Locomotion: Wheelchair Distance: 150' Locomotion: Wheelchair: 0: Activity did not occur FIM - Locomotion: Ambulation Locomotion: Ambulation Assistive Devices: Designer, industrial/product Ambulation/Gait Assistance: 4: Min guard Locomotion: Ambulation: 4: Travels 150 ft or more with minimal assistance (Pt.>75%)  Comprehension Comprehension Mode: Auditory Comprehension: 3-Understands basic 50 - 74% of the time/requires cueing 25 - 50%  of the time  Expression Expression Mode: Verbal Expression: 3-Expresses basic 50 - 74% of the time/requires cueing 25 - 50% of the time. Needs to repeat parts of sentences.  Social Interaction Social Interaction: 4-Interacts appropriately 75 - 89% of the time - Needs redirection for appropriate language or to initiate interaction.  Problem Solving Problem Solving: 3-Solves basic 50 - 74% of the time/requires cueing 25 - 49% of the time  Memory Memory: 2-Recognizes or recalls 25 - 49% of the time/requires cueing 51 - 75% of the time  Medical Problem List and Plan:  1. Bilateral chronic subdural hematoma. Status post left frontal burr hole 09/01/2011  2. DVT Prophylaxis/Anticoagulation: SCDs. Monitor for any signs of DVT  3. Mood. Monitor safety and check sleep chart.   bed alarm for safety.   4. Pain Management: Currently only on Tylenol for pain. Will monitor with increased activity  5. Neuropsych: This patient is not capable of making decisions on his/her own behalf.  6. Hypertension. Maxzide daily. Monitor for any signs of orthostasis Recheck BMET on Thursday.   K supp 7. Hypothyroidism.  Synthroid 8.  R AC joint separation May 2013 with residual pain no precautions 9.  ? Borderline DM HgA1c normal, daily CBG are normal to occasionally borderline. LOS (Days) 7 A FACE TO FACE EVALUATION WAS PERFORMED  Joelene Barriere T 09/13/2011, 8:27 AM

## 2011-09-13 NOTE — Progress Notes (Signed)
Speech Language Pathology Daily Session Note  Patient Details  Name: [name] MRN: [id number] Date of Birth: Dec 26, 1926  Today's Date: 09/13/2011 Time: 0815-0900 Time Calculation (min): 45 min  Short Term Goals: Week 1: SLP Short Term Goal 1 (Week 1): Pt will utilize call bell to express wants/needs witth Mod A question and semantic cues. SLP Short Term Goal 2 (Week 1): Pt will sustain attention to task for ~10 minutes with Min A cues for redirection SLP Short Term Goal 2 - Progress (Week 1): Progressing toward goal SLP Short Term Goal 3 (Week 1): Pt will demonstrate functional problem solving for basic and familiar tasks with Mod A verbald and question cues.  SLP Short Term Goal 4 (Week 1): Pt will utilize external memory aids to recall daily information (schedule, calendar, etc) with Mod A verbal and visual cues. SLP Short Term Goal 4 - Progress (Week 1): Partly met (patient independently utilized wall calendar) SLP Short Term Goal 5 (Week 1): Pt will orient to person, place, situation and time with Mod A semantic and visual cues.  SLP Short Term Goal 6 (Week 1): Pt will consume regular textures and thin liquids without overt s/s of aspiration with supervision verbal cues  Skilled Therapeutic Interventions: Treatment focus on working memory with utilization of compensatory strategies. Pt utilized schedule with Min verbal cues to recall morning events and next therapy session.  Co-treatment with recreational therapy.  Pt taken to piano with focus on selective attention, emergent awareness and short-term memory.  Clinicians unable to find sheet music and pt verbalized emergent awareness of decreased memory and worried of inability to remember how to play the piano or recall songs she could play, however, pt able to play 2 songs upon request with extra time for problem solving. Pt demonstrated selective attention to task with Min A verbal cues for redirection.    FIM:   Comprehension Comprehension Mode: Auditory Comprehension: 3-Understands basic 50 - 74% of the time/requires cueing 25 - 50%  of the time Expression Expression Mode: Verbal Expression: 3-Expresses basic 50 - 74% of the time/requires cueing 25 - 50% of the time. Needs to repeat parts of sentences. Social Interaction Social Interaction: 4-Interacts appropriately 75 - 89% of the time - Needs redirection for appropriate language or to initiate interaction. Problem Solving Problem Solving: 3-Solves basic 50 - 74% of the time/requires cueing 25 - 49% of the time Memory Memory: 2-Recognizes or recalls 25 - 49% of the time/requires cueing 51 - 75% of the time  Pain Pain Assessment Pain Assessment: No/denies pain Pain Score: 0-No pain  Therapy/Group: Individual Therapy  Lawton Dollinger 09/13/2011, 11:18 AM

## 2011-09-13 NOTE — Progress Notes (Signed)
Occupational Therapy Session Note  Patient Details  Name: [name] MRN: [id number] Date of Birth: [birth year]  Today's Date: 09/13/2011 Time: 6295-2841 Time Calculation (min): 45 min  Short Term Goals: Week 1:  OT Short Term Goal 1 (Week 1): STG=LTG secondary to estimated SLOS  Skilled Therapeutic Interventions/Progress Updates:    1:1 self care retraining to include bed mobility, functional ambulation with RW, toileting, showering with assistance for water temperature, simple functional problem solving with familiar self care tasks, safety with RW, orientation (person, place, situation and time) with extra time for word finding. Pt performs familiar self care tasks with distant supervision and requires less cuing- anticipate pt will be able to continue to perform basic self care in her familiar home environment with 24 hour support.  Therapy Documentation Precautions:  Precautions Precautions: Fall Restrictions Weight Bearing Restrictions: No Pain:  headache- made RN aware  See FIM for current functional status  Therapy/Group: Individual Therapy  Roney Mans [facility] 09/13/2011, 7:56 AM

## 2011-09-13 NOTE — Progress Notes (Signed)
Physical Therapy Session Note  Patient Details  Name: [name] MRN: [id number] Date of Birth: 1926-04-15  Today's Date: 09/13/2011 Time:  - 1300-1355   Short Term Goals: Week 1:  PT Short Term Goal 1 (Week 1): Pt will perform sit <> stands with supervision consistently.  PT Short Term Goal 2 (Week 1): Pt will ambulate 100' with min assist. PT Short Term Goal 3 (Week 1): Pt will perform dynamic standing balance activities with min assist.  Skilled Therapeutic Interventions/Progress Updates: Gait training, therapeutic activities to facilitate improved standing balance/ protective balance reactions     Therapy Documentation Precautions:  Precautions Precautions: Fall Restrictions Weight Bearing Restrictions: No General: Pt up in wc     Pain: pt reports chronic pain bilateral hips RT > LT   Mobility: sit to stand close SBA with max vcs for hand placement   Locomotion :Gait to/from gym (120 feet ) RW close SBA with vcs to decrease gait speed for safety; gait over obstacle course using min to SBA with max vcs for safe use of AD        Balance: Standing (without AD) horse shoe toss close SBA ; Biodex limits of stability and weight shifts to facilitate balance reactions       See FIM for current functional status  Therapy/Group: Individual Therapy  Sentoria Brent,JIM 09/13/2011, 7:30 AM

## 2011-09-14 ENCOUNTER — Inpatient Hospital Stay (HOSPITAL_COMMUNITY): Payer: Medicare Other | Admitting: Physical Therapy

## 2011-09-14 ENCOUNTER — Inpatient Hospital Stay (HOSPITAL_COMMUNITY): Payer: Medicare Other | Admitting: Speech Pathology

## 2011-09-14 ENCOUNTER — Inpatient Hospital Stay (HOSPITAL_COMMUNITY): Payer: Medicare Other | Admitting: Occupational Therapy

## 2011-09-14 LAB — GLUCOSE, CAPILLARY: Glucose-Capillary: 122 mg/dL — ABNORMAL HIGH (ref 70–99)

## 2011-09-14 MED ORDER — LISINOPRIL 5 MG PO TABS
5.0000 mg | ORAL_TABLET | Freq: Every day | ORAL | Status: DC
  Administered 2011-09-14 – 2011-09-15 (×2): 5 mg via ORAL
  Filled 2011-09-14 (×3): qty 1

## 2011-09-14 NOTE — Progress Notes (Signed)
Physical Therapy Discharge Summary  Patient Details  Name: [name] MRN: [id number] Date of Birth: [birth year]  Today's Date: 09/14/2011 Time: 4098-1191 Time Calculation (min): 30 min  Skilled Therapeutic Interventions/Progress Updates:  Individual session focused on gait training over variety of surfaces (carpet and tile) again working backwards walking, side stepping performed with supervision. Dynamic balance without UE support watering plants, cues for safe RW negotiation and positioning. Pt continues to need 24 hour supervision secondary to decreased safety awareness.     Patient has met 11 of 11 long term goals due to improved activity tolerance, improved balance, increased strength and improved attention.  Patient to discharge at an ambulatory level Supervision.   Patient's care partner is independent to provide the necessary physical and cognitive assistance at discharge. Pt has made excellent gains with balance and safe functional mobility. Pt's Berg Balance score increased from 8/56 to 39/56 during her stay demonstrating decreased risk for falls.   Reasons goals not met: NA  Recommendation:  Patient will benefit from ongoing skilled PT services in home health setting to continue to advance safe functional mobility, address ongoing impairments in impaired balance, decreased safety awareness, endurance, and minimize fall risk.  Equipment: RW  Reasons for discharge: treatment goals met and discharge from hospital  Patient/family agrees with progress made and goals achieved: Yes  PT Discharge Precautions/Restrictions Precautions Precautions: Fall Restrictions Weight Bearing Restrictions: No Vital Signs Therapy Vitals Temp: 98.1 F (36.7 C) Temp src: Oral Pulse Rate: 59  Resp: 16  BP: 115/64 mmHg Patient Position, if appropriate: Lying Pain Pain Assessment Pain Assessment: No/denies pain Vision/Perception  Vision - History Baseline Vision: Wears glasses all the  time Vision - Assessment Eye Alignment: Within Functional Limits Perception Perception: Within Functional Limits Praxis Praxis: Intact  Cognition Overall Cognitive Status: Impaired Arousal/Alertness: Awake/alert Orientation Level: Oriented X4 Attention: Selective Focused Attention: Appears intact Sustained Attention: Appears intact Selective Attention: Impaired Selective Attention Impairment: Verbal basic;Functional basic Memory: Impaired Memory Impairment: Decreased short term memory Decreased Short Term Memory: Verbal basic;Functional basic Awareness: Impaired Awareness Impairment: Emergent impairment Problem Solving: Impaired Problem Solving Impairment: Verbal basic;Functional basic Executive Function: Organizing;Decision Making;Reasoning;Sequencing Reasoning: Impaired Reasoning Impairment: Verbal basic;Functional basic Sequencing: Impaired Sequencing Impairment: Verbal basic;Functional basic Organizing: Impaired Organizing Impairment: Verbal basic;Functional basic Decision Making: Impaired Decision Making Impairment: Verbal basic;Functional basic Initiating: Appears intact Safety/Judgment: Impaired Sensation Sensation Light Touch: Appears Intact Stereognosis: Appears Intact Hot/Cold: Appears Intact Proprioception: Appears Intact Coordination Gross Motor Movements are Fluid and Coordinated: Yes Fine Motor Movements are Fluid and Coordinated: Yes Motor  Motor Motor: Within Functional Limits Motor - Discharge Observations: generalized weakness  Mobility Transfers Sit to Stand: 5: Supervision Stand to Sit: 5: Supervision Stand Pivot Transfers: 5: Supervision  Trunk/Postural Assessment  Cervical Assessment Cervical Assessment: Within Functional Limits Thoracic Assessment Thoracic Assessment: Within Functional Limits Lumbar Assessment Lumbar Assessment: Within Functional Limits Postural Control Postural Control: Within Functional Limits   Balance Balance Balance Assessed: Yes Static Sitting Balance Static Sitting - Balance Support: Feet supported Static Sitting - Level of Assistance: 6: Modified independent (Device/Increase time) Static Standing Balance Static Standing - Balance Support: During functional activity Static Standing - Level of Assistance: 5: Stand by assistance Dynamic Standing Balance Dynamic Standing - Balance Support: During functional activity Dynamic Standing - Level of Assistance: 5: Stand by assistance Extremity Assessment  RUE Assessment RUE Assessment: Exceptions to [facility] RUE AROM (degrees) RUE Overall AROM Comments: AROM WFL except shoulder movements secondary to shoulder injury occured during fall ~1 month ago.  Shoulder flexion ~80 degrees AROM  LUE AROM (degrees) LUE Overall AROM Comments: AROM WFL except end ranges of shoulder movements      See FIM for current functional status  [name] 09/14/2011, 5:29 PM

## 2011-09-14 NOTE — Progress Notes (Signed)
Subjective/Complaints: No new issues reported A 12 point review of systems has been performed and if not noted above is otherwise negative.   Objective: Vital Signs: Blood pressure 158/67, pulse 60, temperature 98 F (36.7 C), temperature source Oral, resp. rate 16, weight 46.448 kg (102 lb 6.4 oz), SpO2 99.00%. No results found. Results for orders placed during the hospital encounter of 09/06/11 (from the past 72 hour(s))  GLUCOSE, CAPILLARY     Status: Abnormal   Collection Time   09/11/11  9:19 PM      Component Value Range Comment   Glucose-Capillary 100 (*) 70 - 99 mg/dL    Comment 1 Notify RN     GLUCOSE, CAPILLARY     Status: Abnormal   Collection Time   09/12/11  9:10 PM      Component Value Range Comment   Glucose-Capillary 134 (*) 70 - 99 mg/dL    Comment 1 Notify RN     BASIC METABOLIC PANEL     Status: Abnormal   Collection Time   09/13/11  7:03 AM      Component Value Range Comment   Sodium 141  135 - 145 mEq/L    Potassium 3.8  3.5 - 5.1 mEq/L    Chloride 106  96 - 112 mEq/L    CO2 27  19 - 32 mEq/L    Glucose, Bld 88  70 - 99 mg/dL    BUN 15  6 - 23 mg/dL    Creatinine, Ser 2.13  0.50 - 1.10 mg/dL    Calcium 9.3  8.4 - 08.6 mg/dL    GFR calc non Af Amer 78 (*) >90 mL/min    GFR calc Af Amer 90 (*) >90 mL/min   GLUCOSE, CAPILLARY     Status: Abnormal   Collection Time   09/13/11  9:43 PM      Component Value Range Comment   Glucose-Capillary 106 (*) 70 - 99 mg/dL      HEENT: normal Cardio: RRR Resp: CTA B/L GI: BS positive Extremity:  Pulses positive Skin:   Intact L scalp wound CDI with staples Neuro: Confused. Can tell me month and date with cues. Follows simple commands.  Cranial Nerve II-XII normal, Normal Sensory and Apraxic Musc/Skel:  Extremity tender R shoulder AC joint pain   Assessment/Plan: 1. Functional deficits secondary to L frontal SDH with cognitive , behaovior deficits and apraxia which require 3+ hours per day of interdisciplinary  therapy in a comprehensive inpatient rehab setting. Physiatrist is providing close team supervision and 24 hour management of active medical problems listed below. Physiatrist and rehab team continue to assess barriers to discharge/monitor patient progress toward functional and medical goals.  Pt approaching baseline from a cognitive standpoint? Working toward [employer]  FIM: FIM - Bathing Bathing Steps Patient Completed: Chest;Right lower leg (including foot);Right Arm;Left Arm;Abdomen;Front perineal area;Left lower leg (including foot);Buttocks;Right upper leg;Left upper leg Bathing: 5: Set-up assist to: Adjust water temp  FIM - Upper Body Dressing/Undressing Upper body dressing/undressing steps patient completed: Thread/unthread right bra strap;Thread/unthread left bra strap;Hook/unhook bra;Thread/unthread right sleeve of front closure shirt/dress;Thread/unthread left sleeve of front closure shirt/dress;Pull shirt around back of front closure shirt/dress;Button/unbutton shirt Upper body dressing/undressing: 5: Set-up assist to: Obtain clothing/put away FIM - Lower Body Dressing/Undressing Lower body dressing/undressing steps patient completed: Thread/unthread right underwear leg;Thread/unthread left underwear leg;Thread/unthread right pants leg;Thread/unthread left pants leg;Pull underwear up/down;Pull pants up/down;Don/Doff right shoe;Don/Doff left sock;Don/Doff right sock;Don/Doff left shoe Lower body dressing/undressing: 5: Set-up  assist to: Obtain clothing  FIM - Toileting Toileting steps completed by patient: Adjust clothing prior to toileting;Performs perineal hygiene;Adjust clothing after toileting Toileting Assistive Devices: Grab bar or rail for support Toileting: 5: Supervision: Safety issues/verbal cues  FIM - Diplomatic Services operational officer Devices: Elevated toilet seat;Grab bars Toilet Transfers: 5-Set-up assist to: Apply orthosis/W/C setup;5-From toilet/BSC:  Supervision (verbal cues/safety issues)  FIM - Games developer Transfer: 5: Supine > Sit: Supervision (verbal cues/safety issues);5: Bed > Chair or W/C: Supervision (verbal cues/safety issues);5: Chair or W/C > Bed: Supervision (verbal cues/safety issues)  FIM - Locomotion: Wheelchair Distance: 150' Locomotion: Wheelchair: 0: Activity did not occur FIM - Locomotion: Ambulation Locomotion: Ambulation Assistive Devices: Designer, industrial/product Ambulation/Gait Assistance: 4: Min guard Locomotion: Ambulation: 4: Travels 150 ft or more with minimal assistance (Pt.>75%)  Comprehension Comprehension Mode: Auditory Comprehension: 3-Understands basic 50 - 74% of the time/requires cueing 25 - 50%  of the time  Expression Expression Mode: Verbal Expression: 3-Expresses basic 50 - 74% of the time/requires cueing 25 - 50% of the time. Needs to repeat parts of sentences.  Social Interaction Social Interaction: 4-Interacts appropriately 75 - 89% of the time - Needs redirection for appropriate language or to initiate interaction.  Problem Solving Problem Solving: 4-Solves basic 75 - 89% of the time/requires cueing 10 - 24% of the time  Memory Memory: 3-Recognizes or recalls 50 - 74% of the time/requires cueing 25 - 49% of the time  Medical Problem List and Plan:  1. Bilateral chronic subdural hematoma. Status post left frontal burr hole 09/01/2011  2. DVT Prophylaxis/Anticoagulation: SCDs. Monitor for any signs of DVT  3. Mood. Monitor safety and check sleep chart.   bed alarm for safety.   4. Pain Management: Currently only on Tylenol for pain. Will monitor with increased activity  5. Neuropsych: This patient is not capable of making decisions on his/her own behalf.  6. Hypertension. Maxzide daily. Monitor for any signs of orthostasis Recheck BMET TODAY.  -persistently elevated BP. Will add low dose prinivil 7. Hypothyroidism. Synthroid 8.  R AC joint separation May 2013 with residual  pain no precautions 9.  ? Borderline DM HgA1c normal, daily CBG are normal to occasionally borderline. LOS (Days) 8 A FACE TO FACE EVALUATION WAS PERFORMED  SWARTZ,ZACHARY T 09/14/2011, 7:03 AM

## 2011-09-14 NOTE — Progress Notes (Signed)
Occupational Therapy Discharge Summary  Patient Details  Name: [name] MRN: [id number] Date of Birth: 1926-08-22  Today's Date: 09/14/2011 Time: 4098-1191 Time Calculation (min): 30 min  1:1 GRAD DAY: self care retraining at shower level down in ADL apartment with daughter present for family education. Discussed requiring 24 hours supervision, RW safety, tub bench DME as an option for tub transfer verus chair in shower stall, functional mobility, importance of routine to promote independence in a supervised setting.  Patient has met 11 of 11 long term goals due to improved activity tolerance, improved balance, ability to compensate for deficits, improved attention, improved awareness and improved coordination.  Patient to discharge at overall Supervision level.  Patient's care partner is independent to provide the necessary physical and cognitive assistance at discharge.    Reasons goals not met: N/a  Recommendation:  Patient will benefit from ongoing skilled OT services in home health setting to continue to advance functional skills in the area of BADL and iADL.  Equipment: No equipment provided  Reasons for discharge: treatment goals met and discharge from hospital  Patient/family agrees with progress made and goals achieved: Yes  OT Discharge Precautions/Restrictions  Precautions Precautions: Fall Restrictions Weight Bearing Restrictions: No General Amount of Missed OT Time (min): 15 Minutes Vital Signs Therapy Vitals Temp: 98.1 F (36.7 C) Temp src: Oral Pulse Rate: 59  Resp: 16  BP: 115/64 mmHg Patient Position, if appropriate: Lying Pain  no c/o pain ADL  see FIM Vision/Perception  Vision - History Baseline Vision: Wears glasses all the time Vision - Assessment Eye Alignment: Within Functional Limits Perception Perception: Within Functional Limits Praxis Praxis: Intact  Cognition Overall Cognitive Status: Impaired Arousal/Alertness:  Awake/alert Orientation Level: Oriented X4 Attention: Focused;Sustained;Selective Focused Attention: Appears intact Sustained Attention: Appears intact Selective Attention: Impaired Selective Attention Impairment: Verbal basic;Functional basic Memory: Impaired Memory Impairment: Decreased short term memory Awareness: Impaired Awareness Impairment: Emergent impairment Problem Solving: Impaired Problem Solving Impairment: Verbal basic;Functional basic Executive Function: Reasoning;Sequencing;Organizing;Decision Making Reasoning: Impaired Reasoning Impairment: Verbal basic;Functional basic Sequencing: Impaired Sequencing Impairment: Verbal basic;Functional basic Organizing: Impaired Organizing Impairment: Verbal basic;Functional basic Decision Making: Impaired Decision Making Impairment: Verbal basic;Functional basic Initiating: Appears intact Safety/Judgment: Impaired Sensation Sensation Light Touch: Appears Intact Stereognosis: Appears Intact Hot/Cold: Appears Intact Proprioception: Appears Intact Coordination Gross Motor Movements are Fluid and Coordinated: Yes Fine Motor Movements are Fluid and Coordinated: Yes Motor  Motor Motor: Within Functional Limits Motor - Discharge Observations: generalized weakness Mobility  Transfers Sit to Stand: 5: Supervision Stand to Sit: 5: Supervision  Trunk/Postural Assessment  Cervical Assessment Cervical Assessment: Within Functional Limits Thoracic Assessment Thoracic Assessment: Within Functional Limits Lumbar Assessment Lumbar Assessment: Within Functional Limits Postural Control Postural Control: Within Functional Limits  Balance Balance Balance Assessed: Yes Static Sitting Balance Static Sitting - Balance Support: Feet supported Static Sitting - Level of Assistance: 6: Modified independent (Device/Increase time) Static Standing Balance Static Standing - Balance Support: During functional activity Static Standing -  Level of Assistance: 5: Stand by assistance Dynamic Standing Balance Dynamic Standing - Balance Support: During functional activity Dynamic Standing - Level of Assistance: 5: Stand by assistance Extremity/Trunk Assessment RUE Assessment RUE Assessment: Exceptions to [facility] RUE AROM (degrees) RUE Overall AROM Comments: AROM WFL except shoulder movements secondary to shoulder injury occured during fall ~1 month ago. Shoulder flexion ~80 degrees AROM  LUE AROM (degrees) LUE Overall AROM Comments: AROM WFL except end ranges of shoulder movements  See FIM for current functional status  Roney Mans [facility] 09/14/2011,  3:53 PM

## 2011-09-14 NOTE — Progress Notes (Signed)
Physical Therapy Session Note  Patient Details  Name: [name] MRN: [id number] Date of Birth: February 02, 1926  Today's Date: 09/14/2011 Time: 1030-1105 Time Calculation (min): 35 min  Short Term Goals: Week 1:  PT Short Term Goal 1 (Week 1): Pt will perform sit <> stands with supervision consistently.  PT Short Term Goal 2 (Week 1): Pt will ambulate 100' with min assist. PT Short Term Goal 3 (Week 1): Pt will perform dynamic standing balance activities with min assist.  Skilled Therapeutic Interventions/Progress Updates:    Sharlene Motts Balance Test performed, see results below. Improvement from 8/56 to 39/56 during stay indicating significantly reduced risk for falls. Bed <> chair performed with supervision, wheelchair mobility in home environment and community environment supervision level however pt's main mod of locomotion is via ambulation. Car transfer performed with supervision. Curb step performed with min assist for negotiation of RW, ascended/descended 10 steps with one rail, close supervision. Gait x 60' (forwards, back, side stepping) in home environment with RW performed with close supervision and moderate verbal cues for safe RW negotiation. Floor to mat transfer performed with min assist.   Second Session Skilled Therapeutic Interventions/Progress Updates:  Time:  1030-1105 Time Calculation (min): 35 min Pain: 0/10 Pt's daughter late for family education therefore modifications were made to session times/length to accommodate and allow all professions to speak with daughter during available time.  Educated pt's daughter on appropriate caregiver positioning for supervision and verbal cues needed to ensure pt safety. Daughter aware of how to cue pt for safe UE placement with sit <> stand.  Daughter providing supervision with pt ambulation x >200' with RW overall. Educated pt's daughter on floor transfer x 1 rep, pt performing with min assist. Performed car transfer with pt's daughter  providing supervision, min verbal cues from therapist for optimal safety. PT stressed pt's falls risk and need for 24 hour supervision. Daughter reports no further questions.    Therapy Documentation Precautions:  Precautions Precautions: Fall Restrictions Weight Bearing Restrictions: No Pain: Pain Assessment Pain Assessment: No/denies pain with both sessions  Balance: Standardized Balance Assessment Standardized Balance Assessment: Berg Balance Test Berg Balance Test Sit to Stand: Able to stand  independently using hands Standing Unsupported: Able to stand 2 minutes with supervision Sitting with Back Unsupported but Feet Supported on Floor or Stool: Able to sit safely and securely 2 minutes Stand to Sit: Sits safely with minimal use of hands Transfers: Able to transfer with verbal cueing and /or supervision Standing Unsupported with Eyes Closed: Able to stand 10 seconds with supervision Standing Ubsupported with Feet Together: Able to place feet together independently and stand for 1 minute with supervision From Standing, Reach Forward with Outstretched Arm: Can reach forward >12 cm safely (5") From Standing Position, Pick up Object from Floor: Able to pick up shoe, needs supervision From Standing Position, Turn to Look Behind Over each Shoulder: Looks behind one side only/other side shows less weight shift Turn 360 Degrees: Able to turn 360 degrees safely but slowly Standing Unsupported, Alternately Place Feet on Step/Stool: Able to stand independently and complete 8 steps >20 seconds Standing Unsupported, One Foot in Front: Able to take small step independently and hold 30 seconds Standing on One Leg: Tries to lift leg/unable to hold 3 seconds but remains standing independently Total Score: 39   See FIM for current functional status  Therapy/Group: Individual Therapy both sessions  [name] 09/14/2011, 11:27 AM

## 2011-09-14 NOTE — Progress Notes (Signed)
Discharge summary job [id number]

## 2011-09-14 NOTE — Discharge Summary (Signed)
[name], [name]              ACCOUNT NO.:  [id number]  MEDICAL RECORD NO.:  [id number]  LOCATION:  4004                         FACILITY:  [facility]  PHYSICIAN:  Ranelle Oyster, M.D.DATE OF BIRTH:  [birth year]  DATE OF ADMISSION:  09/06/2011 DATE OF DISCHARGE:  09/15/2011                              DISCHARGE SUMMARY   DISCHARGE DIAGNOSES: 1. Bilateral chronic subdural hematoma, status post left frontal bur     hole, September 01, 2011. 2. Sequential compression devices for deep vein thrombosis     prophylaxis. 3. Hypertension. 4. Hypothyroidism.  This is an 78 year old right-handed female with history of fall with bilateral chronic subdural hematoma per evaluation at [facility] about a month ago, who was admitted from [facility] on September 01, 2011, due to headaches, facial droop, and speech difficulties.  Followup cranial CT scan with mild increase in subdural hematoma, and the patient was symptomatic.  She was taken to the operating room on the same day for a left frontal bur hole with placement of SD drain by Dr. Marikay Alar.  Postoperatively with some confusion, she had a sitter at bedside for safety.  She was maintained on a mechanical soft diet.  Drain was discontinued on September 04, 2011. She was admitted for comprehensive rehab program.  PAST MEDICAL HISTORY:  See discharge diagnoses.  SOCIAL HISTORY:  Lives with family assistance as needed.  Functional history prior to admission was independent.  Functional status upon admission to rehab services was moderate assist for overall functional mobility.  PHYSICAL EXAMINATION:  VITAL SIGNS:  Blood pressure 147/73, pulse 65, temperature 97.5, respirations 17. GENERAL:  This was an alert female, well developed. HEAD:  Craniotomy site with staples intact.  Pupils reactive to light. LUNGS:  Clear to auscultation. CARDIAC:  Regular rate and rhythm. ABDOMEN:  Soft, nontender.  Good bowel sounds.   She was oriented to self age, date of birth.  She was able to name her children.  She does perseverate with some decrease in memory and recall with poor awareness of her deficits and impulsive showing some left inattention.  REHABILITATION HOSPITAL COURSE:  The patient was admitted to Inpatient Rehab Services with therapies initiated on a 3-hour daily basis consisting of physical therapy, occupational therapy, speech therapy, and rehabilitation nursing.  The following issues were addressed during the patient's rehabilitation stay.  Pertaining to [name] bilateral chronic subdural hematoma, she had undergone left frontal bur hole craniotomy September 01, 2011, per Dr. Marikay Alar.  Surgical site healing nicely with no signs of infection.  She would follow up with Dr. Yetta Barre as advised.  Her blood pressures remained well controlled on Maxzide on a Monday, Wednesday, and Friday schedule.  She remained on hormone supplement for hypothyroidism.  She did have a right AC joint separation in May of 2013, with some residual pain, no current precautions noted, this did limit her therapy somewhat.  The patient received weekly collaborative interdisciplinary team conferences to discuss estimated length of stay, family teaching, and any barriers to her discharge.  She was continent of bowel and bladder with routine toileting, steady assist for activities of daily living, minimal assist to minimal bar with a  rolling walker for her ambulation.  She still needed maximum assistance for her communication skills due to decreased awareness of her deficits.  Full family teaching was completed and plans to be discharged to home with family in private duty caregiver as needed.  Discharge medications at time of dictation included: 1. Synthroid 75 mcg daily. 2. Potassium chloride 20 mEq twice daily. 3. Maxzide 1 tablet, Monday, Wednesday, and Friday.  Her diet was regular.  She would follow up with Dr.  Marikay Alar, Neurosurgery, 2 weeks call for appointment; Dr. Harold Hedge, October 11, 2011.  Discussed with family the need to follow up with her primary MD for ongoing medical management.  Home health therapies had been arranged     [name], P.A.   ______________________________ Ranelle Oyster, M.D.    DA/MEDQ  D:  09/14/2011  T:  09/14/2011  Job:  [id number]  cc:   Dr. Marikay Alar

## 2011-09-14 NOTE — Plan of Care (Signed)
Problem: RH SAFETY Goal: RH STG ADHERE TO SAFETY PRECAUTIONS W/ASSISTANCE/DEVICE STG Adhere to Safety Precautions With Min Assistance/Device.  Outcome: Not Progressing Patient trying to get out of the bed without assistance. Bed alarm in place and functioning.

## 2011-09-14 NOTE — Progress Notes (Signed)
Recreational Therapy Discharge Summary Patient Details  Name: [name] MRN: [id number] Date of Birth: 1926-12-09 Today's Date: 09/14/2011  Long term goals set: 1  Long term goals met: 1  Comments on progress toward goals: Pt is ready for discharge home tomorrow with family to provide 24 hour supervision/assist.  Pt is currently supervision for simple, familiar tasks in which set up assist is needed and verbal cuing for decreased cognition/safety.  Reasons for discharge: discharge from hospital  Patient/family agrees with progress made and goals achieved: Yes  [name] 09/14/2011, 2:47 PM
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
## 2011-09-14 NOTE — Progress Notes (Signed)
Nutrition Follow-up  Intervention:   Continue current interventions. RD to continue to follow.  Assessment:   Intake remains variable, ranging from 25 - 100%. Weight stable. Pt taking Glucerna Shake some of the time. HgbA1c checked and found to be 5.6. Pt getting daily CBG checks, per MD "borderline".  Diet Order:  Regular Supplements: Glucerna Shake PO BID  Meds: Scheduled Meds:   . feeding supplement  237 mL Oral BID BM  . levothyroxine  75 mcg Oral QAC breakfast  . lisinopril  5 mg Oral Daily  . pantoprazole  40 mg Oral Q1200  . potassium chloride  20 mEq Oral BID  . senna-docusate  1 tablet Oral BID  . triamterene-hydrochlorothiazide  1 each Oral Q M,W,F   Continuous Infusions:  PRN Meds:.acetaminophen, ondansetron (ZOFRAN) IV, ondansetron, sorbitol  Labs:  CMP     Component Value Date/Time   NA 141 09/13/2011 0703   K 3.8 09/13/2011 0703   CL 106 09/13/2011 0703   CO2 27 09/13/2011 0703   GLUCOSE 88 09/13/2011 0703   BUN 15 09/13/2011 0703   CREATININE 0.71 09/13/2011 0703   CALCIUM 9.3 09/13/2011 0703   PROT 6.5 09/07/2011 0630   ALBUMIN 3.2* 09/07/2011 0630   AST 27 09/07/2011 0630   ALT 17 09/07/2011 0630   ALKPHOS 71 09/07/2011 0630   BILITOT 0.4 09/07/2011 0630   GFRNONAA 78* 09/13/2011 0703   GFRAA 90* 09/13/2011 0703   Lab Results  Component Value Date   HGBA1C 5.6 09/08/2011   CBG (last 3)   Basename 09/13/11 2143 09/12/11 2110 09/11/11 2119  GLUCAP 106* 134* 100*      Intake/Output Summary (Last 24 hours) at 09/14/11 1244 Last data filed at 09/14/11 0900  Gross per 24 hour  Intake    960 ml  Output      0 ml  Net    960 ml  BM 8/19  Weight Status:  102 lb - stable  Estimated needs:  1100 - 1300 kcal, 50 - 60 grams protein  Nutrition Dx:  Increased nutrient needs r/t participation in therapies AEB estimated needs.  Goal:  Pt to meet >/= 90% of their estimated nutrition needs - met  Monitor:  Weights, labs, PO intake, I/Os  Jarold Motto MS, RD,  LDN Pager: [phone] After-hours pager: [phone]

## 2011-09-14 NOTE — Progress Notes (Signed)
Speech Language Pathology Session Note & Discharge Summary  Patient Details  Name: [name] MRN: [id number] Date of Birth: 10/21/1926  Today's Date: 09/14/2011 Time: 1135-1200 Time Calculation (min): 25 min  Skilled Therapeutic Intervention: Treatment focus on family education in regards to current cognitive function and strategies to utilize at home to increase working memory, functional problem solving, verbal expression and overall safety. Pt and family verbalized and demonstrated understanding.   Patient has met 5 of 5 long term goals.  Patient to discharge at overall Min;Supervision level.   Reasons goals not met: N/A   Clinical Impression/Discharge Summary: Pt has made functional gains and has met all LTG's this admission. Currently, pt is overall Supervision-Min A for functional problem solving, utilization of short-term memory compensatory strategies, selective attention and emergent awareness. Pt also requires intermittent Min A for verbal expression due to delayed processing and decreased thought organization. Pt/family education complete and pt will discharge with 24 hour assistance. Pt would benefit from f/u home health SLP services to maximize cognitive function and overall independence.   Care Partner:  Caregiver Able to Provide Assistance: Yes  Type of Caregiver Assistance: Physical;Cognitive  Recommendation:  Home Health SLP  Rationale for SLP Follow Up: Maximize cognitive function and independence;Reduce caregiver burden   Equipment: N/A   Reasons for discharge: Treatment goals met;Discharged from hospital   Patient/Family Agrees with Progress Made and Goals Achieved: Yes   See FIM for current functional status  Ugonna Keirsey 09/14/2011, 4:13 PM

## 2011-09-15 MED ORDER — POTASSIUM CHLORIDE CRYS ER 20 MEQ PO TBCR: 20.0000 meq | EXTENDED_RELEASE_TABLET | Freq: Every day | ORAL | Status: DC

## 2011-09-15 MED ORDER — LISINOPRIL 5 MG PO TABS: 5.0000 mg | ORAL_TABLET | Freq: Every day | ORAL | Status: DC

## 2011-09-15 NOTE — Progress Notes (Signed)
Social Work  Discharge Note  The overall goal for the admission was met for:   Discharge location: Yes - home with family providing 24/7 caregiver  Length of Stay: Yes - 9 days  Discharge activity level: Yes - supervision to minimal assistance overall  Home/community participation: Yes  Services provided included: MD, RD, PT, OT, SLP, RN, CM, TR, Pharmacy, Neuropsych and SW  Financial Services: Medicare and Private Insurance: BCBS  Follow-up services arranged: Home Health: PT, OT, ST via [facility], DME: walker via [facility] and Patient/Family has no preference for HH/DME agencies  Comments (or additional information):  Patient/Family verbalized understanding of follow-up arrangements: Yes  Individual responsible for coordination of the follow-up plan: daughter  Confirmed correct DME delivered: Amada Jupiter 09/15/2011    Carmellia Kreisler

## 2011-09-15 NOTE — Progress Notes (Signed)
Patient d/c'd at 1150 to home with family. Patient and family verbalized understanding of d/c instructions provided by D. Anguilli ,PA. Left forearm bruising resolving and no complaint of pain .                 Cleotilde Neer

## 2011-09-15 NOTE — Progress Notes (Signed)
Subjective/Complaints: No new issues reported. Ready for dc A 12 point review of systems has been performed and if not noted above is otherwise negative.   Objective: Vital Signs: Blood pressure 160/59, pulse 66, temperature 98.2 F (36.8 C), temperature source Oral, resp. rate 16, weight 44 kg (97 lb), SpO2 100.00%. No results found. Results for orders placed during the hospital encounter of 09/06/11 (from the past 72 hour(s))  GLUCOSE, CAPILLARY     Status: Abnormal   Collection Time   09/12/11  9:10 PM      Component Value Range Comment   Glucose-Capillary 134 (*) 70 - 99 mg/dL    Comment 1 Notify RN     BASIC METABOLIC PANEL     Status: Abnormal   Collection Time   09/13/11  7:03 AM      Component Value Range Comment   Sodium 141  135 - 145 mEq/L    Potassium 3.8  3.5 - 5.1 mEq/L    Chloride 106  96 - 112 mEq/L    CO2 27  19 - 32 mEq/L    Glucose, Bld 88  70 - 99 mg/dL    BUN 15  6 - 23 mg/dL    Creatinine, Ser 4.78  0.50 - 1.10 mg/dL    Calcium 9.3  8.4 - 29.5 mg/dL    GFR calc non Af Amer 78 (*) >90 mL/min    GFR calc Af Amer 90 (*) >90 mL/min   GLUCOSE, CAPILLARY     Status: Abnormal   Collection Time   09/13/11  9:43 PM      Component Value Range Comment   Glucose-Capillary 106 (*) 70 - 99 mg/dL   GLUCOSE, CAPILLARY     Status: Abnormal   Collection Time   09/14/11  8:27 PM      Component Value Range Comment   Glucose-Capillary 122 (*) 70 - 99 mg/dL      HEENT: normal Cardio: RRR Resp: CTA B/L GI: BS positive Extremity:  Pulses positive Skin:   Intact L scalp wound CDI with staples Neuro: Confused. Can tell me month and date with cues. Follows simple commands.  Cranial Nerve II-XII normal, Normal Sensory and Apraxic Musc/Skel:  Extremity tender R shoulder AC joint pain   Assessment/Plan: 1. Functional deficits secondary to L frontal SDH with cognitive , behaovior deficits and apraxia which require 3+ hours per day of interdisciplinary therapy in a  comprehensive inpatient rehab setting. Physiatrist is providing close team supervision and 24 hour management of active medical problems listed below. Physiatrist and rehab team continue to assess barriers to discharge/monitor patient progress toward functional and medical goals.  Dc today. Goals met   FIM: FIM - Bathing Bathing Steps Patient Completed: Chest;Right upper leg;Left lower leg (including foot);Right Arm;Left Arm;Left upper leg;Abdomen;Front perineal area;Right lower leg (including foot);Buttocks Bathing: 5: Supervision: Safety issues/verbal cues  FIM - Upper Body Dressing/Undressing Upper body dressing/undressing steps patient completed: Thread/unthread right bra strap;Hook/unhook bra;Thread/unthread right sleeve of pullover shirt/dresss;Thread/unthread left bra strap;Thread/unthread left sleeve of pullover shirt/dress;Put head through opening of pull over shirt/dress;Pull shirt over trunk Upper body dressing/undressing: 5: Supervision: Safety issues/verbal cues FIM - Lower Body Dressing/Undressing Lower body dressing/undressing steps patient completed: Thread/unthread right underwear leg;Pull underwear up/down;Thread/unthread left pants leg;Don/Doff right sock;Don/Doff left shoe;Don/Doff left sock;Don/Doff right shoe;Pull pants up/down;Thread/unthread right pants leg;Thread/unthread left underwear leg Lower body dressing/undressing: 5: Supervision: Safety issues/verbal cues  FIM - Toileting Toileting steps completed by patient: Adjust clothing prior to toileting;Adjust clothing  after toileting;Performs perineal hygiene Toileting Assistive Devices: Grab bar or rail for support Toileting: 5: Supervision: Safety issues/verbal cues  FIM - Diplomatic Services operational officer Devices: Elevated toilet seat;Grab bars Toilet Transfers: 5-To toilet/BSC: Supervision (verbal cues/safety issues);5-From toilet/BSC: Supervision (verbal cues/safety issues)  FIM - Bed/Chair  Transfer Bed/Chair Transfer: 6: Supine > Sit: No assist;5: Sit > Supine: Supervision (verbal cues/safety issues);5: Bed > Chair or W/C: Supervision (verbal cues/safety issues);5: Chair or W/C > Bed: Supervision (verbal cues/safety issues)  FIM - Locomotion: Wheelchair Distance: 150' Locomotion: Wheelchair: 5: Travels 150 ft or more: maneuvers on rugs and over door sills with supervision, cueing or coaxing FIM - Locomotion: Ambulation Locomotion: Ambulation Assistive Devices: Designer, industrial/product Ambulation/Gait Assistance: 5: Supervision Locomotion: Ambulation: 5: Travels 150 ft or more with supervision/safety issues  Comprehension Comprehension Mode: Auditory Comprehension: 5-Understands basic 90% of the time/requires cueing < 10% of the time  Expression Expression Mode: Verbal Expression: 4-Expresses basic 75 - 89% of the time/requires cueing 10 - 24% of the time. Needs helper to occlude trach/needs to repeat words.  Social Interaction Social Interaction: 5-Interacts appropriately 90% of the time - Needs monitoring or encouragement for participation or interaction.  Problem Solving Problem Solving: 4-Solves basic 75 - 89% of the time/requires cueing 10 - 24% of the time  Memory Memory: 4-Recognizes or recalls 75 - 89% of the time/requires cueing 10 - 24% of the time  Medical Problem List and Plan:  1. Bilateral chronic subdural hematoma. Status post left frontal burr hole 09/01/2011  2. DVT Prophylaxis/Anticoagulation: SCDs. Monitor for any signs of DVT  3. Mood. Monitor safety and check sleep chart.   bed alarm for safety.   4. Pain Management: Currently only on Tylenol for pain. Will monitor with increased activity  5. Neuropsych: This patient is not capable of making decisions on his/her own behalf.  6. Hypertension. Maxzide daily. Monitor for any signs of orthostasis Recheck BMET TODAY.  -persistently elevated BP. Will add low dose prinivil 7. Hypothyroidism. Synthroid 8.  R AC  joint separation May 2013 with residual pain no precautions 9.  ? Borderline DM HgA1c normal, daily CBG are normal to occasionally borderline. LOS (Days) 9 A FACE TO FACE EVALUATION WAS PERFORMED  Keliyah Spillman T 09/15/2011, 8:40 AM

## 2011-10-11 ENCOUNTER — Encounter: Payer: Self-pay | Admitting: Physical Medicine & Rehabilitation

## 2011-10-11 ENCOUNTER — Encounter: Payer: Medicare Other | Attending: Physical Medicine & Rehabilitation | Admitting: Physical Medicine & Rehabilitation

## 2011-10-11 VITALS — BP 169/62 | HR 63 | Resp 14 | Ht 63.0 in | Wt 101.0 lb

## 2011-10-11 DIAGNOSIS — I6200 Nontraumatic subdural hemorrhage, unspecified: Secondary | ICD-10-CM

## 2011-10-11 DIAGNOSIS — X58XXXA Exposure to other specified factors, initial encounter: Secondary | ICD-10-CM | POA: Insufficient documentation

## 2011-10-11 DIAGNOSIS — R002 Palpitations: Secondary | ICD-10-CM

## 2011-10-11 DIAGNOSIS — S065X9A Traumatic subdural hemorrhage with loss of consciousness of unspecified duration, initial encounter: Secondary | ICD-10-CM

## 2011-10-11 DIAGNOSIS — I4949 Other premature depolarization: Secondary | ICD-10-CM

## 2011-10-11 DIAGNOSIS — S065X0A Traumatic subdural hemorrhage without loss of consciousness, initial encounter: Secondary | ICD-10-CM | POA: Insufficient documentation

## 2011-10-11 NOTE — Progress Notes (Signed)
Subjective:    Patient ID: [name], female    DOB: [birth year], 78 y.o.   MRN: [id number]  HPI  [name] is back regarding her SDH's. She has been home. HH PT has working with her on balance and gait. She has done some walking in and outside of the home. They are practicing walking with a straight cane and she has been using it around the house on her own recently. She hasnt had any falls.   She does have ongoing pain in her upper back as well as her hands. The pain tends to come and go.  Pain Inventory Average Pain 5 Pain Right Now 5 My pain is aching  In the last 24 hours, has pain interfered with the following? General activity 5 Relation with others 5 Enjoyment of life 5 What TIME of day is your pain at its worst? morning Sleep (in general) Fair  Pain is worse with: walking and some activites Pain improves with: rest Relief from Meds: 0  Mobility walk with assistance use a cane use a walker how many minutes can you walk? 15 ability to climb steps?  no do you drive?  no  Function not employed: date last employed   Neuro/Psych bladder control problems tremor  Prior Studies Any changes since last visit?  no  Physicians involved in your care Any changes since last visit?  no   History reviewed. No pertinent family history. History   Social History  . Marital Status: Widowed    Spouse Name: N/A    Number of Children: N/A  . Years of Education: N/A   Occupational History  . retire    Social History Main Topics  . Smoking status: Never Smoker   . Smokeless tobacco: Never Used  . Alcohol Use: No  . Drug Use: No  . Sexually Active: Not Currently   Other Topics Concern  . None   Social History Narrative  . None   Past Surgical History  Procedure Date  . Ines Bloomer hole 09/01/2011    Procedure: Ezekiel Ina;  Surgeon: Tia Alert, MD;  Location: [facility];  Service: Neurosurgery;  Laterality: Left;  Left Ezekiel Ina    Past Medical History    Diagnosis Date  . Subdural hematoma   . Hypertension   . Mitral valvular prolapse   . CVA (cerebrovascular accident)    BP 169/62  Pulse 63  Resp 14  Ht 5\' 3"  (1.6 m)  Wt 101 lb (45.813 kg)  BMI 17.89 kg/m2  SpO2 97%     Review of Systems  HENT: Negative.   Eyes: Negative.   Respiratory: Positive for shortness of breath.   Cardiovascular: Positive for leg swelling.  Gastrointestinal: Negative.   Genitourinary: Positive for urgency.  Musculoskeletal: Negative.   Skin: Negative.   Neurological: Positive for tremors.  Hematological: Negative.   Psychiatric/Behavioral: Negative.        Objective:   Physical Exam  General: Alert and oriented x 3, No apparent distress HEENT: Head is normocephalic, atraumatic, PERRLA, EOMI, sclera anicteric, oral mucosa pink and moist, dentition intact, ext ear canals clear,  Neck: Supple without JVD or lymphadenopathy Heart: Reg rate and rhythm. No murmurs rubs or gallops Chest: CTA bilaterally without wheezes, rales, or rhonchi; no distress Abdomen: Soft, non-tender, non-distended, bowel sounds positive. Extremities: No clubbing, cyanosis, or edema. Pulses are 2+ Skin: Clean and intact without signs of breakdown. Small scab on left scalp Neuro: Pt is cognitively appropriate with normal insight,  memory, and awareness. Cranial nerves 2-12 are intact. Sensory exam is normal. Reflexes are 2+ in all 4's. Fine motor coordination is fair. No tremors. Motor function is grossly 5/5. Shuffling gait. Cognitively she has improved short term memory and awareness. Attention and focus are much improved. Musculoskeletal: Full ROM, mild pain inn the wrists, hands, and right ankle with palpation.Marland Kitchen Posture is fair. Psych: Pt's affect is appropriate. Pt is cooperative         Assessment & Plan:  Medical Problem List and Plan:  1. Bilateral chronic subdural hematoma. Status post left frontal burr hole 09/01/2011  -she is doing quite well  -she should  be using her walker for gait inside the house when unassisted.  -may use her cane for unassisted gait.  -f/u with NS as directed. 2. For pain, recommended use of tylenol  -voltaren gel might be an option

## 2011-10-11 NOTE — Patient Instructions (Addendum)
YOU CAN SAFELY TAKE UP TO 2000MG  TYLENOL PER DAY.  I WOULD LIKE FOR YOU TO USE YOUR WALKER IN THE HOME WHEN SOMEONE IS NOT NEARBY.  IF SOMEBODY WALKS WITH YOU, YOU MAY USE YOUR CANE

## 2011-10-12 ENCOUNTER — Encounter (HOSPITAL_COMMUNITY): Payer: Self-pay | Admitting: *Deleted

## 2011-10-12 ENCOUNTER — Inpatient Hospital Stay (HOSPITAL_COMMUNITY)
Admission: EM | Admit: 2011-10-12 | Discharge: 2011-10-17 | DRG: 066 | Disposition: A | Discharge status: Home / Self Care | Payer: Medicare Other | Source: Other Acute Inpatient Hospital | Attending: Neurological Surgery | Admitting: Neurological Surgery

## 2011-10-12 DIAGNOSIS — S065X9A Traumatic subdural hemorrhage with loss of consciousness of unspecified duration, initial encounter: Secondary | ICD-10-CM

## 2011-10-12 DIAGNOSIS — Z79899 Other long term (current) drug therapy: Secondary | ICD-10-CM

## 2011-10-12 DIAGNOSIS — I10 Essential (primary) hypertension: Secondary | ICD-10-CM | POA: Diagnosis present

## 2011-10-12 DIAGNOSIS — Z8673 Personal history of transient ischemic attack (TIA), and cerebral infarction without residual deficits: Secondary | ICD-10-CM

## 2011-10-12 DIAGNOSIS — IMO0002 Reserved for concepts with insufficient information to code with codable children: Secondary | ICD-10-CM | POA: Diagnosis present

## 2011-10-12 DIAGNOSIS — S065XAA Traumatic subdural hemorrhage with loss of consciousness status unknown, initial encounter: Secondary | ICD-10-CM

## 2011-10-12 DIAGNOSIS — I6200 Nontraumatic subdural hemorrhage, unspecified: Principal | ICD-10-CM | POA: Diagnosis present

## 2011-10-12 DIAGNOSIS — M069 Rheumatoid arthritis, unspecified: Secondary | ICD-10-CM | POA: Diagnosis present

## 2011-10-12 HISTORY — DX: Reserved for concepts with insufficient information to code with codable children: IMO0002

## 2011-10-12 HISTORY — DX: Rheumatoid arthritis, unspecified: M06.9

## 2011-10-12 LAB — CBC
MCH: 30.3 pg (ref 26.0–34.0)
MCH: 30.4 pg (ref 26.0–34.0)
MCHC: 33.6 g/dL (ref 30.0–36.0)
MCHC: 33.6 g/dL (ref 30.0–36.0)
MCV: 90.1 fL (ref 78.0–100.0)
MCV: 90.4 fL (ref 78.0–100.0)
Platelets: 189 10*3/uL (ref 150–400)
Platelets: 183 10*3/uL (ref 150–400)
RDW: 13.1 % (ref 11.5–15.5)
RDW: 13.1 % (ref 11.5–15.5)
WBC: 6.1 10*3/uL (ref 4.0–10.5)
WBC: 5.8 10*3/uL (ref 4.0–10.5)

## 2011-10-12 LAB — BASIC METABOLIC PANEL
Calcium: 9.5 mg/dL (ref 8.4–10.5)
Calcium: 9.7 mg/dL (ref 8.4–10.5)
Chloride: 102 mEq/L (ref 96–112)
Creatinine, Ser: 0.7 mg/dL (ref 0.50–1.10)
Creatinine, Ser: 0.67 mg/dL (ref 0.50–1.10)
GFR calc Af Amer: >90 mL/min (ref >90)
GFR calc non Af Amer: 79 mL/min — ABNORMAL LOW (ref >90)
Potassium: 2.9 mEq/L — ABNORMAL LOW (ref 3.5–5.1)
Sodium: 140 mEq/L (ref 135–145)

## 2011-10-12 MED ORDER — POTASSIUM CHLORIDE CRYS ER 20 MEQ PO TBCR
40.0000 meq | EXTENDED_RELEASE_TABLET | Freq: Once | ORAL | Status: AC
  Administered 2011-10-12: 40 meq via ORAL
  Filled 2011-10-12 (×2): qty 1

## 2011-10-12 MED ORDER — HYDROCODONE-ACETAMINOPHEN 5-325 MG PO TABS
1.0000 | ORAL_TABLET | ORAL | Status: DC | PRN
  Administered 2011-10-12 – 2011-10-16 (×2): 1 via ORAL
  Filled 2011-10-12 (×2): qty 1

## 2011-10-12 MED ORDER — MUPIROCIN 2 % EX OINT
TOPICAL_OINTMENT | Freq: Two times a day (BID) | CUTANEOUS | Status: DC
  Administered 2011-10-12 – 2011-10-13 (×2): via NASAL
  Administered 2011-10-13: 1 via NASAL
  Administered 2011-10-14 – 2011-10-17 (×7): via NASAL
  Filled 2011-10-12: qty 22

## 2011-10-12 MED ORDER — LEVOTHYROXINE SODIUM 75 MCG PO TABS
75.0000 ug | ORAL_TABLET | Freq: Every day | ORAL | Status: DC
  Administered 2011-10-13 – 2011-10-17 (×5): 75 ug via ORAL
  Filled 2011-10-12 (×6): qty 1

## 2011-10-12 MED ORDER — TRIAMTERENE-HCTZ 37.5-25 MG PO TABS
1.0000 | ORAL_TABLET | ORAL | Status: DC
  Administered 2011-10-13 – 2011-10-16 (×2): 1 via ORAL
  Filled 2011-10-12 (×5): qty 1

## 2011-10-12 MED ORDER — LISINOPRIL 5 MG PO TABS
5.0000 mg | ORAL_TABLET | Freq: Every day | ORAL | Status: DC
  Administered 2011-10-12 – 2011-10-17 (×6): 5 mg via ORAL
  Filled 2011-10-12 (×6): qty 1

## 2011-10-12 MED ORDER — POTASSIUM CHLORIDE CRYS ER 20 MEQ PO TBCR
20.0000 meq | EXTENDED_RELEASE_TABLET | Freq: Every day | ORAL | Status: DC
  Administered 2011-10-13 – 2011-10-17 (×5): 20 meq via ORAL
  Filled 2011-10-12 (×5): qty 1

## 2011-10-12 NOTE — Progress Notes (Signed)
Dr. Gerlene Fee contacted pertaining to patient's potassium level of 2.9. Orders received to give Potassium 40 meq po now and repeat at midnight. Will continue to monitor.

## 2011-10-12 NOTE — H&P (Signed)
[name] is an 78 y.o. female.   Chief Complaint: Speech dificulty HPI: 78 yo female who had a twist drill for chronic subdural about 5 weeks ago. She did well after surgery. At that time, her primary issue was speech issues with word finding problems. Last night, she had another similar episode that came on quickly. She was therefore taken to her local ED today where CT head was done which reportedly showed a recurrent collection. Dr Yetta Barre was contacted, and she was transferred for treatment.  Past Medical History  Diagnosis Date  . Subdural hematoma   . Hypertension   . Mitral valvular prolapse   . CVA (cerebrovascular accident)   . Rheumatoid arthritis   . Degenerative disc disease     Past Surgical History  Procedure Date  . Ines Bloomer hole 09/01/2011    Procedure: Ezekiel Ina;  Surgeon: Tia Alert, MD;  Location: [facility];  Service: Neurosurgery;  Laterality: Left;  Left Burr Holes   . Sigmoidcolectomy     History reviewed. No pertinent family history. Social History:  reports that she has never smoked. She has never used smokeless tobacco. She reports that she does not drink alcohol or use illicit drugs.  Allergies:  Allergies  Allergen Reactions  . Sulfa Antibiotics   . Sulfur     Medications Prior to Admission  Medication Sig Dispense Refill  . levothyroxine (SYNTHROID, LEVOTHROID) 75 MCG tablet Take 75 mcg by mouth daily.      Marland Kitchen lisinopril (PRINIVIL,ZESTRIL) 5 MG tablet Take 1 tablet (5 mg total) by mouth daily.  30 tablet  1  . potassium chloride SA (K-DUR,KLOR-CON) 20 MEQ tablet Take 1 tablet (20 mEq total) by mouth daily.  30 tablet  1  . triamterene-hydrochlorothiazide (MAXZIDE-25) 37.5-25 MG per tablet Take 1 tablet by mouth 3 (three) times a week.        Results for orders placed during the hospital encounter of 10/12/11 (from the past 48 hour(s))  BASIC METABOLIC PANEL     Status: Abnormal   Collection Time   10/12/11  5:08 PM      Component Value Range  Comment   Sodium 140  135 - 145 mEq/L    Potassium 2.9 (*) 3.5 - 5.1 mEq/L    Chloride 102  96 - 112 mEq/L    CO2 29  19 - 32 mEq/L    Glucose, Bld 148 (*) 70 - 99 mg/dL    BUN 12  6 - 23 mg/dL    Creatinine, Ser 4.09  0.50 - 1.10 mg/dL    Calcium 9.5  8.4 - 81.1 mg/dL    GFR calc non Af Amer 78 (*) >90 mL/min    GFR calc Af Amer >90  >90 mL/min   CBC     Status: Normal   Collection Time   10/12/11  5:08 PM      Component Value Range Comment   WBC 6.1  4.0 - 10.5 K/uL    RBC 4.03  3.87 - 5.11 MIL/uL    Hemoglobin 12.2  12.0 - 15.0 g/dL    HCT 91.4  78.2 - 95.6 %    MCV 90.1  78.0 - 100.0 fL    MCH 30.3  26.0 - 34.0 pg    MCHC 33.6  30.0 - 36.0 g/dL    RDW 21.3  08.6 - 57.8 %    Platelets 189  150 - 400 K/uL    No results found.  Review of systems  not obtained due to patient factors.  Blood pressure 168/65, pulse 57, temperature 98.2 F (36.8 C), temperature source Oral, resp. rate 14, height 5\' 3"  (1.6 m), weight 43.8 kg (96 lb 9 oz), SpO2 98.00%.  The patient is awake, alert and follows complex commands. her speech is a bit slow, but fluent, and not much deficit noted in that regard. Assessment/Plan CT is reviewed. I am not impressed with much fluid over the brain and no marked mass effect or shift. I think her issue could be some sort of vascualr event as the CT head does not really explain it. Also, the deficit came on fairly acutely, and has also resolved mostly, c/w circulation issue. Will admit the patient tonight, and Dr Yetta Barre will evaluate her in the AM and decide on further treatment.  Reinaldo Meeker, MD 10/12/2011, 5:59 PM

## 2011-10-13 ENCOUNTER — Encounter (HOSPITAL_COMMUNITY)
Admission: EM | Disposition: A | Discharge status: Home / Self Care | Payer: Self-pay | Source: Other Acute Inpatient Hospital | Attending: Neurological Surgery

## 2011-10-13 ENCOUNTER — Inpatient Hospital Stay (HOSPITAL_COMMUNITY): Admission: RE | Admit: 2011-10-13 | Payer: Medicare Other | Source: Ambulatory Visit | Admitting: Neurological Surgery

## 2011-10-13 SURGERY — CRANIOTOMY HEMATOMA EVACUATION SUBDURAL
Anesthesia: General | Laterality: Left

## 2011-10-13 MED ORDER — LEVETIRACETAM 250 MG PO TABS
250.0000 mg | ORAL_TABLET | Freq: Two times a day (BID) | ORAL | Status: DC
  Administered 2011-10-13 – 2011-10-17 (×9): 250 mg via ORAL
  Filled 2011-10-13 (×10): qty 1

## 2011-10-13 NOTE — Progress Notes (Signed)
INITIAL ADULT NUTRITION ASSESSMENT Date: 10/13/2011   Time: 12:59 PM Reason for Assessment: Malnutrition Screening Tool- 2  ASSESSMENT: Female 78 y.o.  Dx: Speech difficulty- bilateral chronic subdural hematoma s/p left frontal burr hole 09/01/11  Past Medical History  Diagnosis Date  . Subdural hematoma   . Hypertension   . Mitral valvular prolapse   . CVA (cerebrovascular accident)   . Rheumatoid arthritis   . Degenerative disc disease    Scheduled Meds:    . levETIRAcetam  250 mg Oral BID  . levothyroxine  75 mcg Oral QAC breakfast  . lisinopril  5 mg Oral Daily  . mupirocin ointment   Nasal BID  . potassium chloride SA  20 mEq Oral Daily  . potassium chloride  40 mEq Oral Once  . potassium chloride  40 mEq Oral Once  . triamterene-hydrochlorothiazide  1 each Oral 3 times weekly   Continuous Infusions:  PRN Meds:.HYDROcodone-acetaminophen   Ht: 5\' 3"  (160 cm)  Wt: 98 lb 5.2 oz (44.6 kg)  Ideal Wt: 52.4 kg  % Ideal Wt: 85%  Usual Wt: ~100 lb recently % Usual Wt: 98% Wt Readings from Last 10 Encounters:  10/13/11 98 lb 5.2 oz (44.6 kg)  10/13/11 98 lb 5.2 oz (44.6 kg)  10/11/11 101 lb (45.813 kg)  09/15/11 97 lb (44 kg)  09/05/11 100 lb 15.5 oz (45.8 kg)  09/05/11 100 lb 15.5 oz (45.8 kg)  12/21/08 115 lb (52.164 kg)  10/20/08 119 lb (53.978 kg)  10/09/08 115 lb (52.164 kg)    Body mass index is 17.42 kg/(m^2). Underweight  Food/Nutrition Related Hx:  CHO modified diet at home. Per daughter, pt had a fall in May 2013 and since then pt has lost approx. 10 lb (9% weight loss in 4 months classified as significant weight loss). However, recently pt has been eating well PTA. Pt just upgraded to Regular diet from NPO today- lunch tray was arrived during visit.  Labs:  CMP     Component Value Date/Time   NA 140 10/12/2011 1823   K 3.1* 10/12/2011 1823   CL 102 10/12/2011 1823   CO2 29 10/12/2011 1823   GLUCOSE 103* 10/12/2011 1823   BUN 13 10/12/2011 1823   CREATININE 0.67 10/12/2011 1823   CALCIUM 9.7 10/12/2011 1823   PROT 6.5 09/07/2011 0630   ALBUMIN 3.2* 09/07/2011 0630   AST 27 09/07/2011 0630   ALT 17 09/07/2011 0630   ALKPHOS 71 09/07/2011 0630   BILITOT 0.4 09/07/2011 0630   GFRNONAA 79* 10/12/2011 1823   GFRAA >90 10/12/2011 1823     Intake/Output Summary (Last 24 hours) at 10/13/11 1259 Last data filed at 10/13/11 0800  Gross per 24 hour  Intake    720 ml  Output   1150 ml  Net   -430 ml    Diet Order: Regular   Supplements/Tube Feeding: None at this time.  IVF:    Estimated Nutritional Needs:   Kcal: 1200-1350 Protein: 45-55 gm Fluid: 1.3-1.4 L  NUTRITION DIAGNOSIS: -Underweight (NI-3.1).  Status: Ongoing  RELATED TO: fall and subsequent surgery  AS EVIDENCE BY: BMI of 17.4  MONITORING/EVALUATION(Goals): Goal: Pt to consume >/= 90% meals in order to promote gradual weight gain. Monitor: PO intake, weight, labs   EDUCATION NEEDS: -No education needs identified at this time  INTERVENTION: Encourage PO intake at meals. May want to add supplement supplement (Glucerna, per pt preference) in the future if poor PO intake.   DOCUMENTATION CODES Per approved  criteria  -Underweight    [name] 10/13/2011, 12:59 PM

## 2011-10-13 NOTE — Progress Notes (Signed)
Patient is experiencing a period of altered mental status, upon arriving she was alert and oriented, but now she has phases of altered mental status, her daughters have noticed a change as well. MD, Yetta Barre, paged, he came up and assessed the patient and stated she was good and that she will have periods of AMS.    Patient is stable, VSS, will continue to monitor

## 2011-10-13 NOTE — Clinical Documentation Improvement (Signed)
Abnormal Labs Clarification  THIS DOCUMENT IS NOT A PERMANENT PART OF THE MEDICAL RECORD  Please update your documentation within the medical record to reflect your response to this query.                                                                                   10/13/11  Dear Dr. Yetta Barre,  In a better effort to capture your patient's severity of illness, reflect appropriate length of stay and utilization of resources, a review of the medical record has revealed the following indicators.   Based on your clinical judgment, please clarify and document in a progress note and/or discharge summary the clinical condition associated with the following supporting information: In responding to this query please exercise your independent judgment.  The fact that a query is asked, does not imply that any particular answer is desired or expected.    Hello Dr. Yetta Barre!  Abnormal findings (laboratory, x-ray, pathologic, and other diagnostic results) are not coded and reported unless the physician indicates their clinical significance.   The medical record reflects the following clinical findings; if possible, please clarify the diagnostic and/or clinical significance:      Component      Potassium  Latest Ref Rng      3.5 - 5.1 mEq/L  09/13/2011      3.8  10/12/2011     5:08 PM 2.9 (L)  10/12/2011     6:23 PM 3.1 (L)   - potassium chloride SA (K-DUR,KLOR-CON) CR tablet 40 mEq once ordered on 9/19 - potassium chloride SA (K-DUR,KLOR-CON) CR tablet 20 mEq daily ordered on 9/19  Possible Clinical Conditions?  - Hypokalemia  - Other condition (please document in the progress notes and/or discharge summary)  - Cannot Clinically determine at this time       No additional documentation in chart upon review. SM    Thank You,  Saul Fordyce  Clinical Documentation Specialist: [phone] Pager  [facility] Information Management [place]

## 2011-10-13 NOTE — Clinical Documentation Improvement (Signed)
BMI DOCUMENTATION CLARIFICATION QUERY  THIS DOCUMENT IS NOT A PERMANENT PART OF THE MEDICAL RECORD         10/13/11  Dear Dr. Yetta Barre,  In an effort to better capture your patient's severity of illness, reflect appropriate length of stay and utilization of resources, a review of the patient medical record has revealed the following indicators.   Based on your clinical judgment, please clarify and document in a progress note and/or discharge summary the clinical condition associated with the following supporting information: In responding to this query please exercise your independent judgment.  The fact that a query is asked, does not imply that any particular answer is desired or expected.   Hello Dr. Yetta Barre,  According to the documented Height and Weight in [facility]/EPIC, the patients BMI is 17.1. If your clinical findings/judgment agrees with this and if possible, could you please help clarify the suspected diagnosis in the progress note and discharge summary. THANK YOU!    [facility]: A diagnosis of UNDERWEIGHT or MORBID OBESITY should have the BMI documented along with it.  Possible Clinical Conditions?  - Underweight  - Other condition (please document in the progress notes and/or discharge summary)  - Cannot Clinically determine at this time    Supporting Information:  Body mass index is 17.42 kg/(m^2). Filed Weights   10/12/11 1635 10/12/11 1645 10/13/11 0500  Weight: 96 lb 9 oz (43.8 kg) 96 lb 9 oz (43.8 kg) 98 lb 5.2 oz (44.6 kg)  Height: 5'3"  On 10/12/11   No additional documentation in chart upon review. SM    Thank You,  Saul Fordyce  Clinical Documentation Specialist: [phone] Pager  Health Information Management [place]

## 2011-10-13 NOTE — Progress Notes (Signed)
Patient ID: [name], female   DOB: 1926-02-16, 78 y.o.   MRN: [id number] Patient looks good today. I can really find no evidence of aphasia. She can name objects, mild difficulty with fluency, good repetition, follows commands. No pronator drift.  Awake alert and conversant. I will cancel the MRI and her surgery for now. CT scan was reviewed and shows small bilateral extra-axial fluid collections, the one on the right is new since the last time she was here but she denies a recent fall. Neurologically she is better than yesterday. I will transfer her to the floor and repeat her CT scan after the weekend. I still think she'll probably come to need a left-sided craniotomy but we'll make sure that this is a growing hematoma in the cause of her symptoms before putting her through that type of surgery.

## 2011-10-14 NOTE — Evaluation (Signed)
Physical Therapy Evaluation Patient Details Name: [name] MRN: [id number] DOB: 1926/03/21 Today's Date: 10/14/2011 Time: 7846-9629 PT Time Calculation (min): 12 min  PT Assessment / Plan / Recommendation Clinical Impression  78 yo adm with incr aphasia after recent admission for bil SDH requiring burr hole drainage. CT showed ? incr fluid and pt is being monitored for possible craniotomy. Pt moving better with PT compared to last admission and now has 24 hired assist at home. Will follow (unless pt taken to surgery and then will need new order to resume).    PT Assessment  Patient needs continued PT services    Follow Up Recommendations  Home health PT;Supervision/Assistance - 24 hour    Barriers to Discharge None      Equipment Recommendations  None recommended by PT    Recommendations for Other Services     Frequency Min 3X/week    Precautions / Restrictions Precautions Precautions: Fall   Pertinent Vitals/Pain No pain reported or indicated      Mobility  Bed Mobility Bed Mobility: Not assessed (met pt with OT up in hallway) Transfers Transfers: Sit to Stand;Stand to Sit Sit to Stand: 4: Min guard;With upper extremity assist;With armrests;From chair/3-in-1 Stand to Sit: 4: Min guard;With upper extremity assist;With armrests;To chair/3-in-1 Details for Transfer Assistance: pt with min sway posteriorly as completing sit to stand with UEs abducting to help her regain balance Ambulation/Gait Ambulation/Gait Assistance: 4: Min guard Ambulation Distance (Feet): 100 Feet Assistive device: None Ambulation/Gait Assistance Details: guarded with use of gait belt at waist; pt able to incr and decr velocity signficantly with no unsteadiness/staggering.  Gait Pattern: Step-through pattern;Decreased stride length    Exercises     PT Diagnosis: Difficulty walking;Altered mental status  PT Problem List: Decreased strength;Decreased balance;Decreased mobility;Decreased  cognition;Decreased knowledge of use of DME PT Treatment Interventions: DME instruction;Gait training;Functional mobility training;Therapeutic activities;Balance training;Cognitive remediation;Patient/family education   PT Goals Acute Rehab PT Goals PT Goal Formulation: With patient Time For Goal Achievement: 10/21/11 Potential to Achieve Goals: Good Pt will go Supine/Side to Sit: with supervision PT Goal: Supine/Side to Sit - Progress: Goal set today Pt will go Sit to Supine/Side: with supervision;with HOB 0 degrees PT Goal: Sit to Supine/Side - Progress: Goal set today Pt will go Sit to Stand: with supervision PT Goal: Sit to Stand - Progress: Goal set today Pt will Ambulate: 51 - 150 feet;with supervision;with least restrictive assistive device PT Goal: Ambulate - Progress: Goal set today Additional Goals Additional Goal #1: Pt will complete standardized balance assessment to further delineate fall risk (i.e. Berg, TUG, gait velocity) PT Goal: Additional Goal #1 - Progress: Goal set today  Visit Information  Last PT Received On: 10/14/11 Assistance Needed: +1    Subjective Data  Subjective: Pt reports she may have surgery Monday Patient Stated Goal: unable to state; agrees she wants to get stronger   Prior Functioning  Home Living Lives With: Alone Available Help at Discharge: Other (Comment);Available 24 hours/day (aid ) Type of Home: House Home Access: Stairs to enter [employer] of Steps: 1 Home Layout: One level Bathroom Shower/Tub: Forensic scientist: Standard Home Adaptive Equipment: Grab bars in shower;Quad cane Prior Function Level of Independence: Independent with assistive device(s) Able to Take Stairs?: Yes Driving: No Communication Communication: No difficulties Dominant Hand: Right    Cognition  Overall Cognitive Status: No family/caregiver present to determine baseline cognitive functioning (impaired at baseline--unsure  if has declined further) Area of Impairment: Memory Arousal/Alertness: Awake/alert Orientation  Level: Time Behavior During Session: [facility] for tasks performed Current Attention Level: Selective Memory Deficits: unable to recall recent inpatient rehab admission;  Following Commands: Follows one step commands consistently    Extremity/Trunk Assessment Right Lower Extremity Assessment RLE ROM/Strength/Tone: WFL for tasks assessed RLE ROM/Strength/Tone Deficits: 4/5 knee extension RLE Sensation: WFL - Light Touch RLE Coordination: WFL - gross motor Left Lower Extremity Assessment LLE ROM/Strength/Tone: WFL for tasks assessed LLE ROM/Strength/Tone Deficits: knee extension 4/5 LLE Sensation: WFL - Light Touch LLE Coordination: WFL - gross motor Trunk Assessment Trunk Assessment: Normal   Balance Balance Balance Assessed: Yes Static Standing Balance Static Standing - Balance Support: No upper extremity supported Static Standing - Level of Assistance: 5: Stand by assistance Tandem Stance - Right Leg: 0  (unable to attain position) Rhomberg - Eyes Opened: 30  Rhomberg - Eyes Closed: 15  High Level Balance High Level Balance Comments: able to turn 360 in 12 steps with supervision; able to reach with RUE 6 inches  End of Session    GP     Ayce Pietrzyk 10/14/2011, 10:45 AM  Pager [phone]

## 2011-10-14 NOTE — Evaluation (Signed)
Occupational Therapy Evaluation Patient Details Name: [name] MRN: [id number] DOB: 07-14-1926 Today's Date: 10/14/2011 Time: 4098-1191 OT Time Calculation (min): 18 min  OT Assessment / Plan / Recommendation Clinical Impression  78 yo adm with incr aphasia after recent admission for bil SDH requiring burr hole drainage. CT showed ? incr fluid and pt is being monitored for possible craniotomy. Pt will benefit from skilled OT in the acute setting to maximize I with ADL and ADL mobility prior to d/c.     OT Assessment  Patient needs continued OT Services    Follow Up Recommendations  Home health OT;Supervision/Assistance - 24 hour    Barriers to Discharge      Equipment Recommendations  None recommended by PT;None recommended by OT    Recommendations for Other Services    Frequency  Min 2X/week    Precautions / Restrictions Precautions Precautions: Fall Restrictions Weight Bearing Restrictions: No   Pertinent Vitals/Pain Pt denies any pain at this time.    ADL  Eating/Feeding: Simulated;Set up Where Assessed - Eating/Feeding: Chair Grooming: Performed;Wash/dry hands;Min guard Where Assessed - Grooming: Unsupported standing Upper Body Dressing: Simulated;Supervision/safety;Set up Where Assessed - Upper Body Dressing: Unsupported sitting Lower Body Dressing: Simulated;Minimal assistance Where Assessed - Lower Body Dressing: Supported sit to stand Toilet Transfer: Simulated;Min Pension scheme manager Method: Sit to Barista: Regular height toilet;Grab bars Toileting - Architect and Hygiene: Simulated;Min guard Where Assessed - Engineer, mining and Hygiene: Standing Equipment Used: Gait belt Transfers/Ambulation Related to ADLs: Min guard A with ambulation >124ft    OT Diagnosis: Generalized weakness;Cognitive deficits  OT Problem List: Decreased activity tolerance;Impaired balance (sitting and/or standing);Decreased  knowledge of use of DME or AE;Decreased safety awareness;Decreased knowledge of precautions OT Treatment Interventions: Self-care/ADL training;DME and/or AE instruction;Therapeutic activities;Cognitive remediation/compensation;Balance training;Patient/family education   OT Goals Acute Rehab OT Goals OT Goal Formulation: With patient Time For Goal Achievement: 10/21/11 Potential to Achieve Goals: Good ADL Goals Pt Will Perform Grooming: Standing at sink;Independently ADL Goal: Grooming - Progress: Goal set today Pt Will Perform Upper Body Bathing: Standing at sink;with supervision ADL Goal: Upper Body Bathing - Progress: Goal set today Pt Will Perform Lower Body Bathing: with supervision;with set-up;Standing at sink ADL Goal: Lower Body Bathing - Progress: Goal set today Pt Will Perform Lower Body Dressing: with supervision;Sit to stand from chair;Sit to stand from bed ADL Goal: Lower Body Dressing - Progress: Goal set today Pt Will Transfer to Toilet: Independently;Ambulation ADL Goal: Toilet Transfer - Progress: Goal set today Pt Will Perform Toileting - Clothing Manipulation: Independently;Standing ADL Goal: Toileting - Clothing Manipulation - Progress: Goal set today  Visit Information  Last OT Received On: 10/14/11 Assistance Needed: +1    Subjective Data  Subjective: I may have to have surgery on Monday Patient Stated Goal: Return home   Prior Functioning  Vision/Perception  Home Living Lives With: Alone Available Help at Discharge: Other (Comment);Available 24 hours/day (aid ) Type of Home: House Home Access: Stairs to enter [employer] of Steps: 1 Home Layout: One level Bathroom Shower/Tub: Forensic scientist: Standard Home Adaptive Equipment: Grab bars in shower;Quad cane Prior Function Level of Independence: Independent with assistive device(s) Able to Take Stairs?: Yes Driving: No Communication Communication: No  difficulties Dominant Hand: Right      Cognition  Overall Cognitive Status: No family/caregiver present to determine baseline cognitive functioning Area of Impairment: Memory Arousal/Alertness: Awake/alert Orientation Level: Time;Disoriented to Behavior During Session: [facility] for tasks performed Current Attention  Level: Selective Memory Deficits: unable to recall recent inpatient rehab admission; however, able to remember past burr hole sx Following Commands: Follows one step commands consistently    Extremity/Trunk Assessment Right Upper Extremity Assessment RUE ROM/Strength/Tone: WFL for tasks assessed RUE Coordination: WFL - gross motor Left Upper Extremity Assessment LUE ROM/Strength/Tone: WFL for tasks assessed LUE Coordination: WFL - gross motor Right Lower Extremity Assessment RLE ROM/Strength/Tone: WFL for tasks assessed RLE ROM/Strength/Tone Deficits: 4/5 knee extension RLE Sensation: WFL - Light Touch RLE Coordination: WFL - gross motor Left Lower Extremity Assessment LLE ROM/Strength/Tone: WFL for tasks assessed LLE ROM/Strength/Tone Deficits: knee extension 4/5 LLE Sensation: WFL - Light Touch LLE Coordination: WFL - gross motor Trunk Assessment Trunk Assessment: Normal   Mobility  Shoulder Instructions  Bed Mobility Bed Mobility: Not assessed (met pt with OT up in hallway) Transfers Sit to Stand: 4: Min guard;With upper extremity assist;With armrests;From chair/3-in-1 Stand to Sit: 4: Min guard;With upper extremity assist;With armrests;To chair/3-in-1 Details for Transfer Assistance: pt with min sway posteriorly as completing sit to stand with UEs abducting to help her regain balance       Exercise     Balance Balance Balance Assessed: Yes Static Standing Balance Static Standing - Balance Support: No upper extremity supported Static Standing - Level of Assistance: 5: Stand by assistance Tandem Stance - Right Leg: 0  (unable to attain position) Rhomberg -  Eyes Opened: 30  Rhomberg - Eyes Closed: 15  High Level Balance High Level Balance Comments: able to turn 360 in 12 steps with supervision; able to reach with RUE 6 inches   End of Session OT - End of Session Equipment Utilized During Treatment: Gait belt Activity Tolerance: Patient tolerated treatment well Patient left: in chair (with PT in room)  GO     Gaetana Kawahara 10/14/2011, 11:26 AM

## 2011-10-14 NOTE — Progress Notes (Signed)
Overall stable. Denies headache currently.  She is afebrile. Her vitals are stable. Urine output is good. She is awake and alert with mild stable confusion. Moves all 4 extremities well with equal motor strength.  No overall change. Continue observation. Plan repeat CT on Monday to reassess subdural hemorrhage.

## 2011-10-15 NOTE — Progress Notes (Signed)
Subjective: Patient reports Offers no complaints feels reasonably comfortable minimum if any headache  Objective: Vital signs in last 24 hours: Temp:  [97.3 F (36.3 C)-98.6 F (37 C)] 97.9 F (36.6 C) (09/22 1021) Pulse Rate:  [61-77] 68  (09/22 1021) Resp:  [16-18] 16  (09/22 1021) BP: (113-167)/(46-64) 167/61 mmHg (09/22 1021) SpO2:  [96 %-100 %] 97 % (09/22 1021)  Intake/Output from previous day: 09/21 0701 - 09/22 0700 In: 480 [P.O.:480] Out: -  Intake/Output this shift:    Moving all 4 extremities no evidence of cortical drift. Patient does appear mildly confused  Lab Results:  Basename 10/12/11 1823 10/12/11 1708  WBC 5.8 6.1  HGB 12.4 12.2  HCT 36.9 36.3  PLT 183 189   BMET  Basename 10/12/11 1823 10/12/11 1708  NA 140 140  K 3.1* 2.9*  CL 102 102  CO2 29 29  GLUCOSE 103* 148*  BUN 13 12  CREATININE 0.67 0.70  CALCIUM 9.7 9.5    Studies/Results: No results found.  Assessment/Plan: Stable subdural hematomas  LOS: 3 days  Reassess subdurals with CT scan in a.m.   [name] J 10/15/2011, 10:56 AM

## 2011-10-15 NOTE — Progress Notes (Signed)
Patient resting in bed; reports feeling sad d/t missing family event this am.  Support given. Denies any pain or discomfort at this time.

## 2011-10-16 ENCOUNTER — Inpatient Hospital Stay (HOSPITAL_COMMUNITY): Payer: Medicare Other

## 2011-10-16 NOTE — Progress Notes (Signed)
Physical Therapy Treatment Patient Details Name: [name] MRN: [id number] DOB: May 22, 1926 Today's Date: 10/16/2011 Time: 4098-1191 PT Time Calculation (min): 18 min  PT Assessment / Plan / Recommendation Comments on Treatment Session  Pt continues with word finding problems, and daughter reports cognition is much improved. Pt remains unsteady and RW placed in her room to utilize with nursing and therapies.    Follow Up Recommendations  Home health PT;Supervision/Assistance - 24 hour    Barriers to Discharge        Equipment Recommendations  None recommended by PT    Recommendations for Other Services    Frequency Min 3X/week   Plan Discharge plan remains appropriate;Frequency remains appropriate    Precautions / Restrictions Precautions Precautions: Fall   Pertinent Vitals/Pain "all over" due to RA; pt refused pain medicine    Mobility  Bed Mobility Bed Mobility: Rolling Right;Right Sidelying to Sit;Sitting - Scoot to [place] of Bed Rolling Right: 5: Supervision Right Sidelying to Sit: 5: Supervision;HOB flat Sitting - Scoot to Edge of Bed: 5: Supervision Details for Bed Mobility Assistance: supervision due to effortful and nearly required help to overcome gravity and move into seated position Transfers Transfers: Sit to Stand;Stand to Sit Sit to Stand: 4: Min assist;With upper extremity assist;From bed;From toilet Stand to Sit: 4: Min guard;With upper extremity assist;To chair/3-in-1;To toilet Details for Transfer Assistance: pt with loss of balance posteriorly on initial attempt to stand from the bed and safely returned to sitting EOB with min assist; stood 2 more times from bed and toilet without loss of balance Ambulation/Gait Ambulation/Gait Assistance: 4: Min assist Ambulation Distance (Feet): 180 Feet Assistive device: 1 person hand held assist;None Ambulation/Gait Assistance Details: guarded, stiff, antalgic gait pattern; HHA vs no device; after walking daughter  reported pt used a RW all the time when walking alone and practiced with a cane when with family or PT Gait Pattern: Step-through pattern;Decreased stride length;Right foot flat;Left foot flat;Right flexed knee in stance;Left flexed knee in stance;Shuffle;Wide base of support    Exercises     PT Diagnosis:    PT Problem List:   PT Treatment Interventions:     PT Goals Acute Rehab PT Goals Pt will go Supine/Side to Sit: with supervision PT Goal: Supine/Side to Sit - Progress: Partly met Pt will go Sit to Stand: with supervision PT Goal: Sit to Stand - Progress: Progressing toward goal Pt will Ambulate: 51 - 150 feet;with supervision;with least restrictive assistive device PT Goal: Ambulate - Progress: Progressing toward goal  Visit Information  Last PT Received On: 10/16/11 Assistance Needed: +1    Subjective Data  Subjective: Reports she doesn't know what they are going to decide about surgery. Occasional word finding problems   Cognition  Overall Cognitive Status: History of cognitive impairments - at baseline (daughter reports this is better than adm and her new baselin) Area of Impairment: Memory Arousal/Alertness: Awake/alert Behavior During Session: Anxious Memory Deficits: 9/21 pt told me she walked at home with a cane; daughter reports today she uses a RW all the time    Balance  Balance Balance Assessed: No (dinner arrived while out to get RW; pt eating on return)  End of Session PT - End of Session Activity Tolerance: Patient limited by fatigue;Patient limited by pain Patient left: in chair;with call bell/phone within reach;with family/visitor present Nurse Communication: Mobility status   GP     [name] 10/16/2011, 5:19 PM Pager [phone]

## 2011-10-16 NOTE — Progress Notes (Signed)
Patient assisted into wheelchair to go to radiology for CT scan. Left unit via WC with transporter. [name]

## 2011-10-16 NOTE — Progress Notes (Signed)
Patient ID: [name], female   DOB: 1926/11/26, 78 y.o.   MRN: [id number] Patient looks good today. She is awake and alert and conversant. No trouble with naming fluency or repetition. Moves all extremities. CT were reviewed and show stable bilateral subdural hematomas. The one on the right is quite small the one on the left measures about a centimeter without a lot of mass effect or shift. At this point is difficult to know if further treatment of these offers more risk or more benefit.

## 2011-10-17 MED ORDER — LEVETIRACETAM 250 MG PO TABS: 250.0000 mg | ORAL_TABLET | Freq: Two times a day (BID) | ORAL | Status: DC

## 2011-10-17 NOTE — Discharge Summary (Signed)
Physician Discharge Summary  Patient ID: [name] MRN: [id number] DOB/AGE: 09/25/1926 78 y.o.  Admit date: 10/12/2011 Discharge date: 10/17/2011  Admission Diagnoses: SDH    Discharge Diagnoses: same   Discharged Condition: good  Hospital Course: The patient was admitted on 10/12/2011 with a recurrent L SDH 5 weeks after burr hole. She also has a small R SDH. She had brief episodes of aphasia that cleared in the hospital after keppra was started. The hospital course was routine. She was moved to the floor on HD 2. The patient remained afebrile with stable vital signs, and tolerated a regular diet. The patient continued to increase activities, and denied headache or weakness.    Consults: None  Significant Diagnostic Studies:  Results for orders placed during the hospital encounter of 10/12/11  BASIC METABOLIC PANEL      Component Value Range   Sodium 140  135 - 145 mEq/L   Potassium 2.9 (*) 3.5 - 5.1 mEq/L   Chloride 102  96 - 112 mEq/L   CO2 29  19 - 32 mEq/L   Glucose, Bld 148 (*) 70 - 99 mg/dL   BUN 12  6 - 23 mg/dL   Creatinine, Ser 5.62  0.50 - 1.10 mg/dL   Calcium 9.5  8.4 - 13.0 mg/dL   GFR calc non Af Amer 78 (*) >90 mL/min   GFR calc Af Amer >90  >90 mL/min  CBC      Component Value Range   WBC 6.1  4.0 - 10.5 K/uL   RBC 4.03  3.87 - 5.11 MIL/uL   Hemoglobin 12.2  12.0 - 15.0 g/dL   HCT 86.5  78.4 - 69.6 %   MCV 90.1  78.0 - 100.0 fL   MCH 30.3  26.0 - 34.0 pg   MCHC 33.6  30.0 - 36.0 g/dL   RDW 29.5  28.4 - 13.2 %   Platelets 189  150 - 400 K/uL  SURGICAL PCR SCREEN      Component Value Range   MRSA, PCR NEGATIVE  NEGATIVE   Staphylococcus aureus NEGATIVE  NEGATIVE  CBC      Component Value Range   WBC 5.8  4.0 - 10.5 K/uL   RBC 4.08  3.87 - 5.11 MIL/uL   Hemoglobin 12.4  12.0 - 15.0 g/dL   HCT 44.0  10.2 - 72.5 %   MCV 90.4  78.0 - 100.0 fL   MCH 30.4  26.0 - 34.0 pg   MCHC 33.6  30.0 - 36.0 g/dL   RDW 36.6  44.0 - 34.7 %   Platelets 183  150  - 400 K/uL  BASIC METABOLIC PANEL      Component Value Range   Sodium 140  135 - 145 mEq/L   Potassium 3.1 (*) 3.5 - 5.1 mEq/L   Chloride 102  96 - 112 mEq/L   CO2 29  19 - 32 mEq/L   Glucose, Bld 103 (*) 70 - 99 mg/dL   BUN 13  6 - 23 mg/dL   Creatinine, Ser 4.25  0.50 - 1.10 mg/dL   Calcium 9.7  8.4 - 95.6 mg/dL   GFR calc non Af Amer 79 (*) >90 mL/min   GFR calc Af Amer >90  >90 mL/min    Ct Head Wo Contrast  10/16/2011  *RADIOLOGY REPORT*  Clinical Data: Subdural hematomas.  CT HEAD WITHOUT CONTRAST  Technique:  Contiguous axial images were obtained from the base of the skull through the vertex without contrast.  Comparison: 09/02/2011  Findings: The patient has small subacute on chronic bilateral subdural hematomas with no midline shift.  There is diffuse atrophy.  Size of the ventricles has slightly diminished.  Left frontal burr hole is noted.  Air in the subdural space seen on the prior exam has resolved.  Since the prior exam of 09/02/2011 the right subdural hematoma has slightly diminished and the left subdural hematoma is slightly increased.  IMPRESSION: Persistent bilateral subdural hematomas, slightly increased on the left.  These appear to be subacute on chronic.   Original Report Authenticated By: Gwynn Burly, M.D.     Antibiotics:  Anti-infectives    None      Discharge Exam: Blood pressure 138/49, pulse 61, temperature 98.1 F (36.7 C), temperature source Oral, resp. rate 20, height 5\' 3"  (1.6 m), weight 44.6 kg (98 lb 5.2 oz), SpO2 96.00%. Neurologic: Grossly normal   Discharge Medications:     Medication List     As of 10/17/2011 12:53 PM    TAKE these medications         levETIRAcetam 250 MG tablet   Commonly known as: KEPPRA   Take 1 tablet (250 mg total) by mouth 2 (two) times daily.      levothyroxine 75 MCG tablet   Commonly known as: SYNTHROID, LEVOTHROID   Take 75 mcg by mouth daily.      lisinopril 5 MG tablet   Commonly known as:  PRINIVIL,ZESTRIL   Take 1 tablet (5 mg total) by mouth daily.      triamterene-hydrochlorothiazide 37.5-25 MG per tablet   Commonly known as: MAXZIDE-25   Take 1 tablet by mouth 3 (three) times a week.        Disposition: home, f/u 2 weeks with head CT  Final Dx: SDH      Discharge Orders    Future Orders Please Complete By Expires   Diet - low sodium heart healthy      Increase activity slowly      Call MD for:  temperature >100.4      Call MD for:  persistant nausea and vomiting      Call MD for:  severe uncontrolled pain      Call MD for:  redness, tenderness, or signs of infection (pain, swelling, redness, odor or green/yellow discharge around incision site)      Call MD for:  difficulty breathing, headache or visual disturbances         Follow-up Information    Follow up with JONES,DAVID S, MD. Schedule an appointment as soon as possible for a visit in 2 weeks.   Contact information:   [address] [place] [address] [phone]           Signed: Tia Alert 10/17/2011, 12:53 PM

## 2011-10-17 NOTE — Care Management Note (Signed)
    Page 1 of 1   10/17/2011     4:45:04 PM   CARE MANAGEMENT NOTE 10/17/2011  Patient:  [name], [name]   Account Number:  [id number]  Date Initiated:  10/13/2011  Documentation initiated by:  Jacquelynn Cree  Subjective/Objective Assessment:   Admitted with subdural hygroma.Recent  SDH, surgery, inpatient rehab and HHPT.     Action/Plan:   Anticipated DC Date:  10/16/2011   Anticipated DC Plan:  HOME W HOME HEALTH SERVICES         Choice offered to / List presented to:  C-4 Adult Children        HH arranged  HH-2 PT  HH-3 OT      [facility] agency  [facility]   Status of service:  Completed, signed off Medicare Important Message given?   (If response is "NO", the following Medicare IM given date fields will be blank) Date Medicare IM given:   Date Additional Medicare IM given:    Discharge Disposition:  HOME W HOME HEALTH SERVICES  Per UR Regulation:  Reviewed for med. necessity/level of care/duration of stay  If discussed at Long Length of Stay Meetings, dates discussed:    Comments:

## 2013-08-16 IMAGING — CT CT HEAD W/O CM
1 of 2 series · 13 of 30 positions shown, 17 images · non-contrast
Comparison: 09/02/2011

CLINICAL DATA: Subdural hematomas.

CT HEAD WITHOUT CONTRAST
TECHNIQUE: Contiguous axial images were obtained from the base of
the skull through the vertex without contrast.

[Series 2: brain · axial · 0.47mm/px · z∈[+149,+279]mm · 13 of 32 slices shown, 17 images]
[im 3/32  brain]
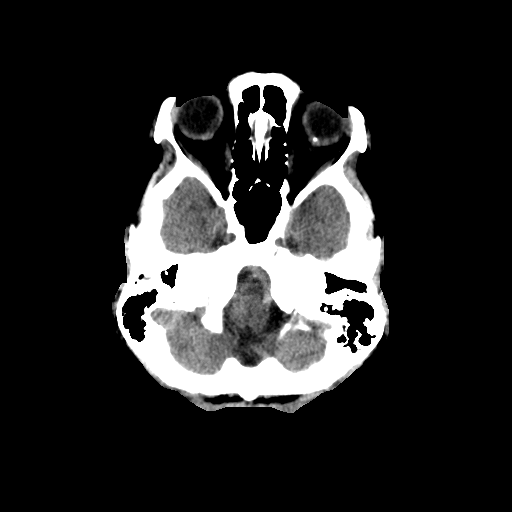
[im 3/32  bone]
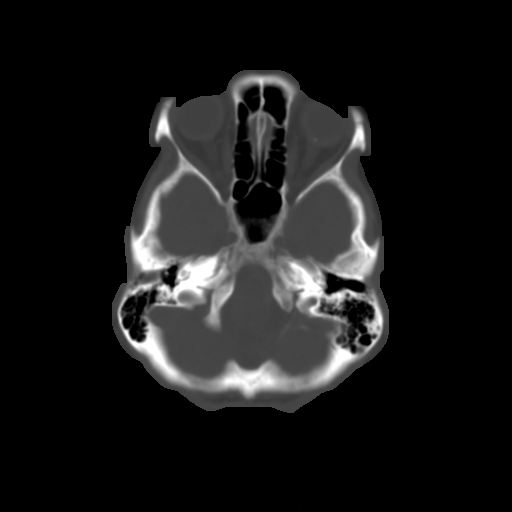
[im 5/32  brain]
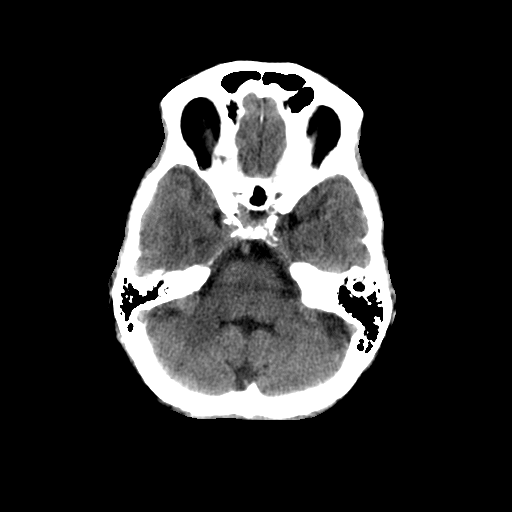
[im 7/32  brain]
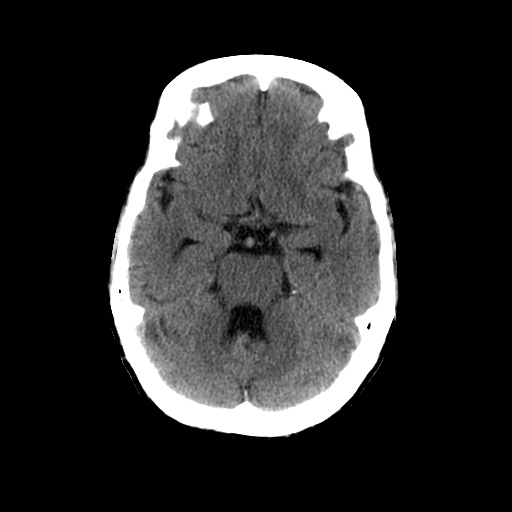
[im 9/32  brain]
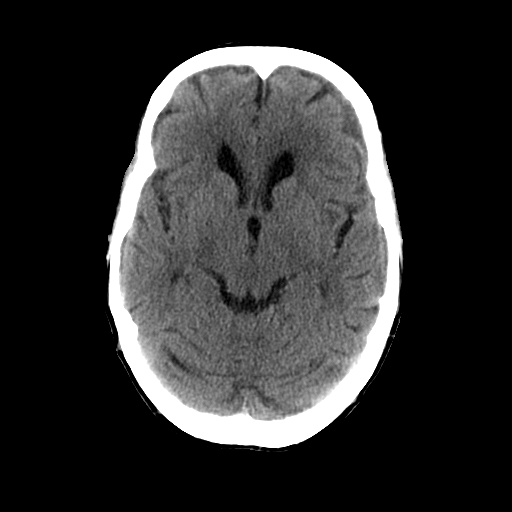
[im 12/32  brain]
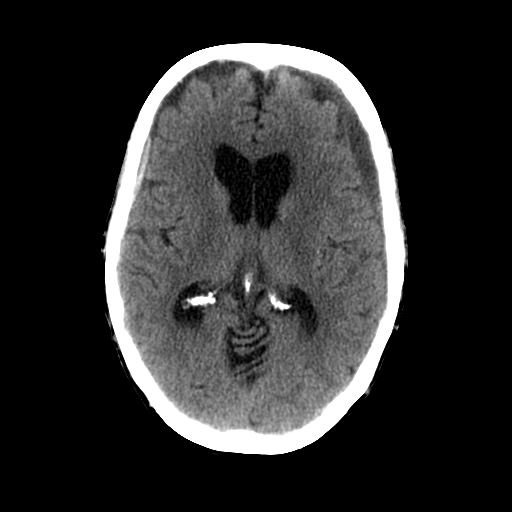
[im 12/32  bone]
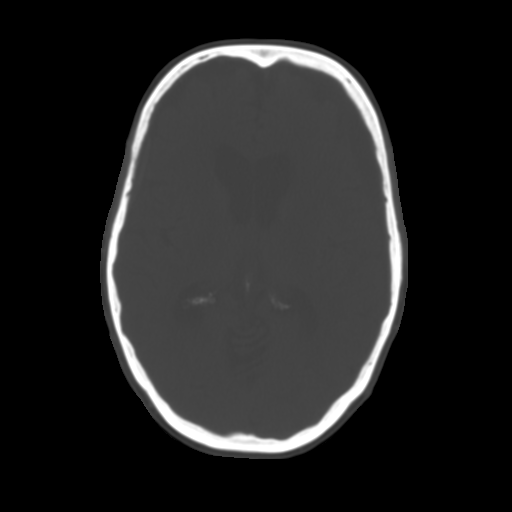
[im 14/32  brain]
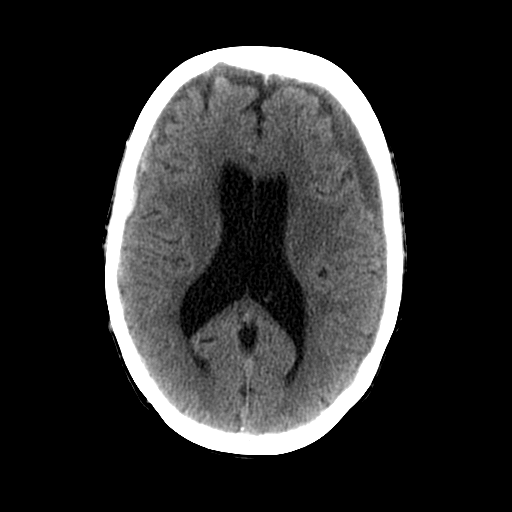
[im 16/32  brain]
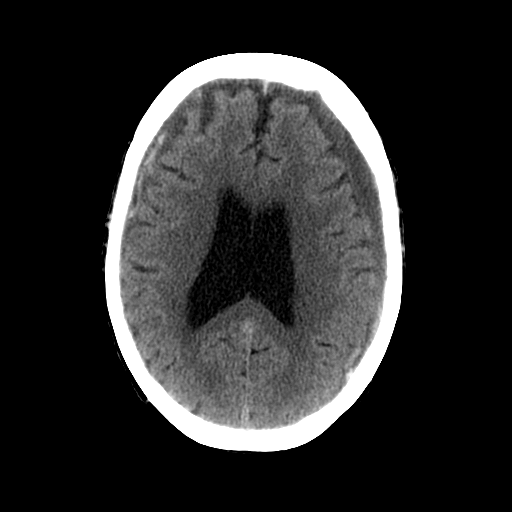
[im 18/32  brain]
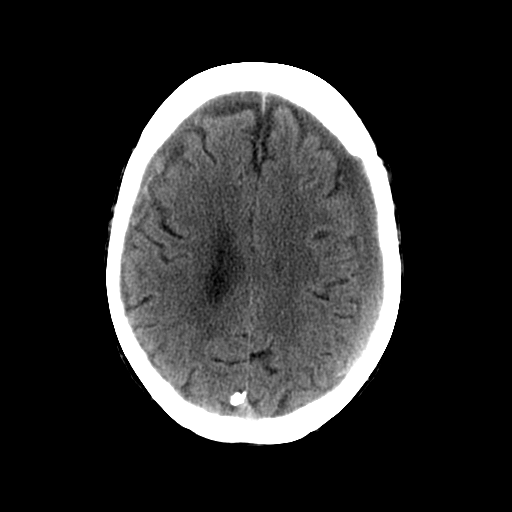
[im 20/32  brain]
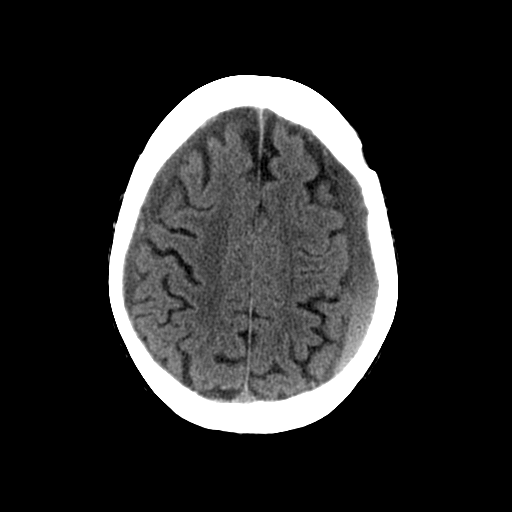
[im 20/32  bone]
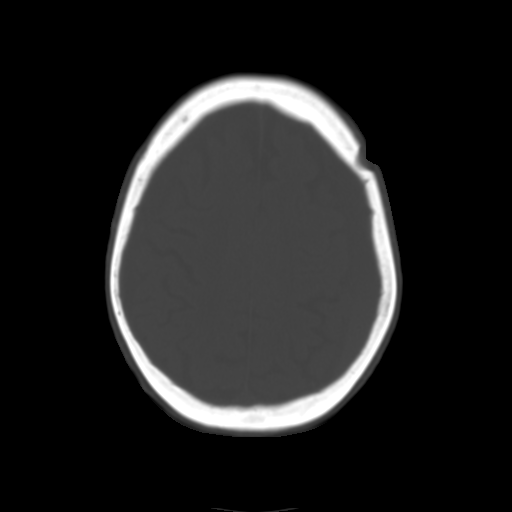
[im 23/32  brain]
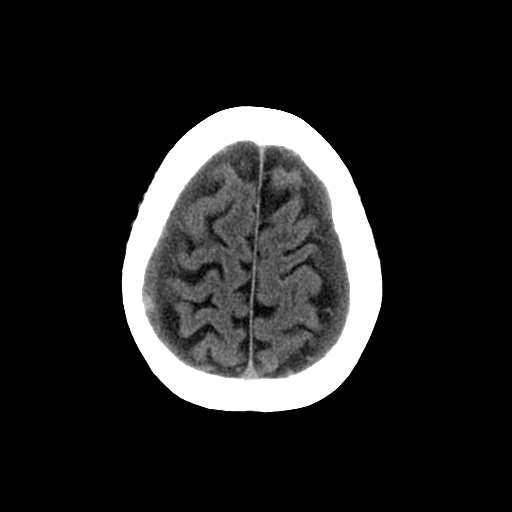
[im 25/32  brain]
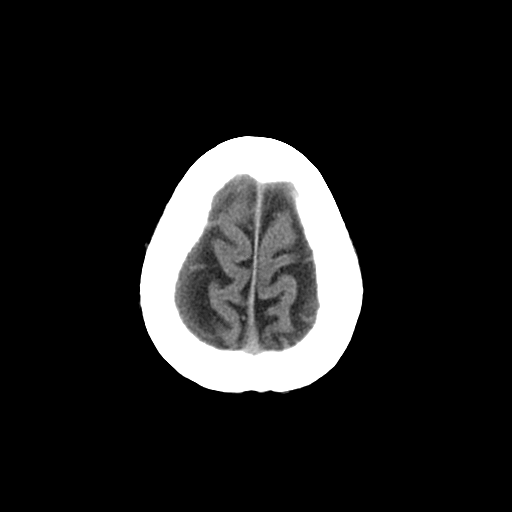
[im 27/32  brain]
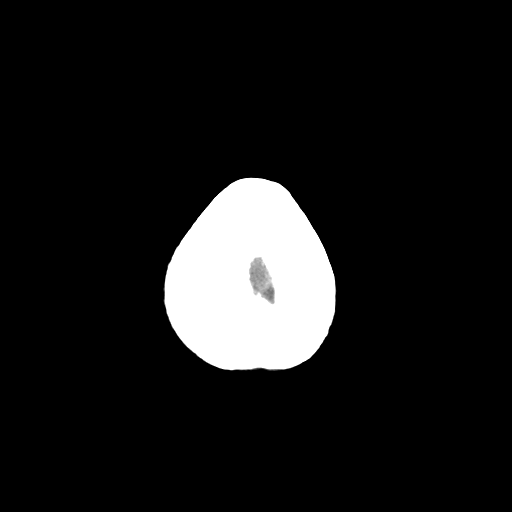
[im 29/32  brain]
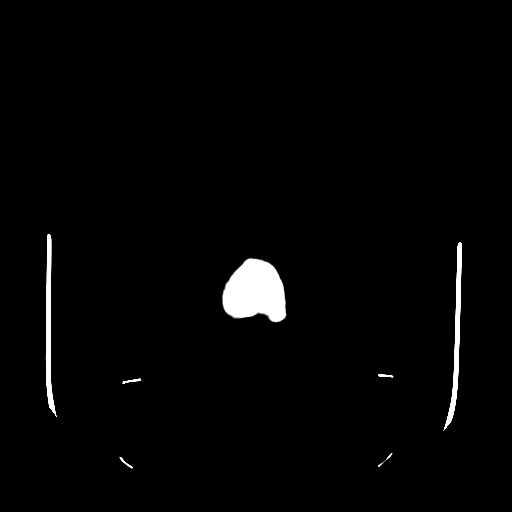
[im 29/32  bone]
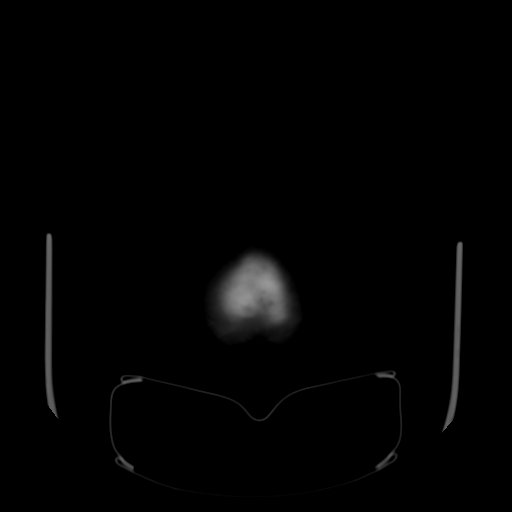

[13 of 30 positions shown; findings below may reference images not displayed]

FINDINGS: The patient has small subacute on chronic bilateral
subdural hematomas with no midline shift.  There is diffuse
atrophy.  Size of the ventricles has slightly diminished.  Left
frontal burr hole is noted.  Air in the subdural space seen on the
prior exam has resolved.

Since the prior exam of 09/02/2011 the right subdural hematoma has
slightly diminished and the left subdural hematoma is slightly
increased.
IMPRESSION: Persistent bilateral subdural hematomas, slightly increased on the
left.  These appear to be subacute on chronic.

## 2013-11-24 ENCOUNTER — Encounter (HOSPITAL_COMMUNITY): Payer: Self-pay | Admitting: *Deleted

## 2014-12-11 ENCOUNTER — Observation Stay (HOSPITAL_COMMUNITY): Payer: Medicare Other

## 2014-12-11 ENCOUNTER — Inpatient Hospital Stay (HOSPITAL_COMMUNITY)
Admission: AD | Admit: 2014-12-11 | Discharge: 2014-12-15 | DRG: 101 | Disposition: A | Discharge status: Skilled Nursing Facility | Payer: Medicare Other | Source: Other Acute Inpatient Hospital | Attending: Internal Medicine | Admitting: Internal Medicine

## 2014-12-11 DIAGNOSIS — E039 Hypothyroidism, unspecified: Secondary | ICD-10-CM | POA: Diagnosis not present

## 2014-12-11 DIAGNOSIS — G4089 Other seizures: Principal | ICD-10-CM | POA: Diagnosis present

## 2014-12-11 DIAGNOSIS — R569 Unspecified convulsions: Secondary | ICD-10-CM | POA: Diagnosis not present

## 2014-12-11 DIAGNOSIS — Z8673 Personal history of transient ischemic attack (TIA), and cerebral infarction without residual deficits: Secondary | ICD-10-CM | POA: Diagnosis not present

## 2014-12-11 DIAGNOSIS — B9620 Unspecified Escherichia coli [E. coli] as the cause of diseases classified elsewhere: Secondary | ICD-10-CM | POA: Diagnosis not present

## 2014-12-11 DIAGNOSIS — I10 Essential (primary) hypertension: Secondary | ICD-10-CM | POA: Diagnosis not present

## 2014-12-11 DIAGNOSIS — N390 Urinary tract infection, site not specified: Secondary | ICD-10-CM | POA: Diagnosis present

## 2014-12-11 DIAGNOSIS — F0390 Unspecified dementia without behavioral disturbance: Secondary | ICD-10-CM | POA: Diagnosis not present

## 2014-12-11 DIAGNOSIS — M069 Rheumatoid arthritis, unspecified: Secondary | ICD-10-CM | POA: Diagnosis not present

## 2014-12-11 DIAGNOSIS — Z993 Dependence on wheelchair: Secondary | ICD-10-CM | POA: Diagnosis not present

## 2014-12-11 DIAGNOSIS — E876 Hypokalemia: Secondary | ICD-10-CM | POA: Diagnosis not present

## 2014-12-11 DIAGNOSIS — Z23 Encounter for immunization: Secondary | ICD-10-CM

## 2014-12-11 DIAGNOSIS — Z66 Do not resuscitate: Secondary | ICD-10-CM | POA: Diagnosis present

## 2014-12-11 DIAGNOSIS — I119 Hypertensive heart disease without heart failure: Secondary | ICD-10-CM | POA: Diagnosis not present

## 2014-12-11 DIAGNOSIS — I639 Cerebral infarction, unspecified: Secondary | ICD-10-CM

## 2014-12-11 DIAGNOSIS — R4182 Altered mental status, unspecified: Secondary | ICD-10-CM | POA: Diagnosis present

## 2014-12-11 DIAGNOSIS — G20 Parkinson's disease: Secondary | ICD-10-CM | POA: Diagnosis not present

## 2014-12-11 DIAGNOSIS — T17908A Unspecified foreign body in respiratory tract, part unspecified causing other injury, initial encounter: Secondary | ICD-10-CM

## 2014-12-11 LAB — CBC
HEMATOCRIT: 37.8 % (ref 36.0–46.0)
HEMOGLOBIN: 12.2 g/dL (ref 12.0–15.0)
MCH: 29.4 pg (ref 26.0–34.0)
MCHC: 32.3 g/dL (ref 30.0–36.0)
MCV: 91.1 fL (ref 78.0–100.0)
Platelets: 218 10*3/uL (ref 150–400)
RBC: 4.15 MIL/uL (ref 3.87–5.11)
RDW: 13.6 % (ref 11.5–15.5)
WBC: 8.9 10*3/uL (ref 4.0–10.5)

## 2014-12-11 LAB — BASIC METABOLIC PANEL
Anion gap: 7 (ref 5–15)
BUN: 6 mg/dL (ref 6–20)
CHLORIDE: 104 mmol/L (ref 101–111)
CO2: 28 mmol/L (ref 22–32)
CREATININE: 0.55 mg/dL (ref 0.44–1.00)
Calcium: 8.6 mg/dL — ABNORMAL LOW (ref 8.9–10.3)
GFR calc Af Amer: >60 mL/min (ref >60)
GFR calc non Af Amer: >60 mL/min (ref >60)
GLUCOSE: 170 mg/dL — AB (ref 65–99)
Potassium: 3.5 mmol/L (ref 3.5–5.1)
Sodium: 139 mmol/L (ref 135–145)

## 2014-12-11 LAB — GLUCOSE, CAPILLARY: Glucose-Capillary: 148 mg/dL — ABNORMAL HIGH (ref 65–99)

## 2014-12-11 MED ORDER — SODIUM CHLORIDE 0.9 % IV SOLN
500.0000 mg | Freq: Two times a day (BID) | INTRAVENOUS | Status: DC
  Filled 2014-12-11 (×4): qty 5

## 2014-12-11 MED ORDER — SENNOSIDES-DOCUSATE SODIUM 8.6-50 MG PO TABS: 1.0000 | ORAL_TABLET | Freq: Every day | ORAL | Status: DC

## 2014-12-11 MED ORDER — LEVOTHYROXINE SODIUM 100 MCG IV SOLR
25.0000 ug | Freq: Every day | INTRAVENOUS | Status: DC
  Administered 2014-12-12 – 2014-12-13 (×2): 25 ug via INTRAVENOUS
  Filled 2014-12-11 (×2): qty 5

## 2014-12-11 MED ORDER — SENNOSIDES-DOCUSATE SODIUM 8.6-50 MG PO TABS: 1.0000 | ORAL_TABLET | Freq: Every evening | ORAL | Status: DC | PRN

## 2014-12-11 MED ORDER — HEPARIN SODIUM (PORCINE) 5000 UNIT/ML IJ SOLN
5000.0000 [IU] | Freq: Three times a day (TID) | INTRAMUSCULAR | Status: DC
  Administered 2014-12-11 – 2014-12-15 (×11): 5000 [IU] via SUBCUTANEOUS
  Filled 2014-12-11 (×12): qty 1

## 2014-12-11 MED ORDER — HYDRALAZINE HCL 20 MG/ML IJ SOLN: 10.0000 mg | Freq: Four times a day (QID) | INTRAMUSCULAR | Status: DC | PRN

## 2014-12-11 MED ORDER — METRONIDAZOLE 0.75 % EX GEL
1.0000 "application " | Freq: Two times a day (BID) | CUTANEOUS | Status: DC
  Administered 2014-12-12 – 2014-12-15 (×7): 1 via TOPICAL
  Filled 2014-12-11: qty 45

## 2014-12-11 MED ORDER — MAGNESIUM 200 MG PO TABS
200.0000 mg | ORAL_TABLET | Freq: Every day | ORAL | Status: DC
  Filled 2014-12-11: qty 1

## 2014-12-11 MED ORDER — POTASSIUM CHLORIDE ER 10 MEQ PO TBCR
10.0000 meq | EXTENDED_RELEASE_TABLET | Freq: Every day | ORAL | Status: DC
  Filled 2014-12-11 (×2): qty 1

## 2014-12-11 MED ORDER — STROKE: EARLY STAGES OF RECOVERY BOOK
Freq: Once | Status: DC
  Filled 2014-12-11: qty 1

## 2014-12-11 MED ORDER — TRAMADOL HCL 50 MG PO TABS: 50.0000 mg | ORAL_TABLET | Freq: Four times a day (QID) | ORAL | Status: DC | PRN

## 2014-12-11 MED ORDER — CIPROFLOXACIN IN D5W 400 MG/200ML IV SOLN
400.0000 mg | Freq: Two times a day (BID) | INTRAVENOUS | Status: DC
  Administered 2014-12-11 – 2014-12-12 (×2): 400 mg via INTRAVENOUS
  Filled 2014-12-11 (×4): qty 200

## 2014-12-11 MED ORDER — ACETAMINOPHEN 325 MG PO TABS: 650.0000 mg | ORAL_TABLET | Freq: Four times a day (QID) | ORAL | Status: DC | PRN

## 2014-12-11 NOTE — Consult Note (Signed)
NEURO HOSPITALIST CONSULT NOTE   Requestig physician: Dr. Elvera Lennox   Reason for Consult: Altered mental status   HPI:                                                                                                                                          [name] is an 81 y.o. female with severe dementia, baseline nonambulatory and nonverbal, lives at a nursing facility. She is seen with 3 of her daughters at bedside. Has a history of bilateral subdural hematomas with evacuation in 2013. This morning she is reportedly had a generalized tonic-clonic convulsive type of seizure at her nursing facility marked weakness by her daughters. No clear description of the seizure is available. Since then she is been very drowsy poorly responsive. I spoke to the ER physician at the outside hospital prior to the transfer. She was given a loading dose of IV fosphenytoin 15 mg/kg prior to transfer. No further clinical seizures. History obtained from patient's daughters. Patient is very drowsy and unable to provide any history  Past Medical History  Diagnosis Date  . Subdural hematoma   . Hypertension   . Mitral valvular prolapse   . CVA (cerebrovascular accident)   . Rheumatoid arthritis(714.0)   . Degenerative disc disease     Past Surgical History  Procedure Laterality Date  . Ines Bloomer hole  09/01/2011    Procedure: Ezekiel Ina;  Surgeon: Tia Alert, MD;  Location: [facility];  Service: Neurosurgery;  Laterality: Left;  Left Burr Holes   . Sigmoidcolectomy      No family history on file.  Family History: HTN, CAD   Social History:  reports that she has never smoked. She has never used smokeless tobacco. She reports that she does not drink alcohol or use illicit drugs.  Allergies  Allergen Reactions  . Contrast Media [Iodinated Diagnostic Agents]     unknown  . Sulfa Antibiotics     unknown  . Sulfur     MEDICATIONS:                                                                                                                      I have reviewed the patient's current medications. Prior to Admission:  Prescriptions prior to admission  Medication Sig Dispense Refill Last Dose  . levETIRAcetam (KEPPRA) 250 MG tablet Take 1 tablet (250 mg total) by mouth 2 (two) times daily. 60 tablet 1   . levothyroxine (SYNTHROID, LEVOTHROID) 75 MCG tablet Take 75 mcg by mouth daily.   10/12/2011 at Unknown  . lisinopril (PRINIVIL,ZESTRIL) 5 MG tablet Take 1 tablet (5 mg total) by mouth daily. 30 tablet 1 10/12/2011 at Unknown  . triamterene-hydrochlorothiazide (MAXZIDE-25) 37.5-25 MG per tablet Take 1 tablet by mouth 3 (three) times a week.   Unk   Current Facility-Administered Medications  Medication Dose Route Frequency Provider Last Rate Last Dose  .  stroke: mapping our early stages of recovery book   Does not apply Once Stephani Police, PA-C      . acetaminophen (TYLENOL) tablet 650 mg  650 mg Oral Q6H PRN Stephani Police, PA-C      . ciprofloxacin (CIPRO) IVPB 400 mg  400 mg Intravenous Q12H Marianne L York, PA-C      . heparin injection 5,000 Units  5,000 Units Subcutaneous 3 times per day Stephani Police, PA-C      . hydrALAZINE (APRESOLINE) injection 10 mg  10 mg Intravenous Q6H PRN Stephani Police, PA-C      . levETIRAcetam (KEPPRA) 500 mg in sodium chloride 0.9 % 100 mL IVPB  500 mg Intravenous Q12H Marianne L York, PA-C   500 mg at 12/11/14 1755  . [START ON 12/12/2014] levothyroxine (SYNTHROID, LEVOTHROID) injection 25 mcg  25 mcg Intravenous Daily Stephani Police, PA-C      . [START ON 12/12/2014] Magnesium TABS 200 mg  200 mg Oral Daily Marianne L York, PA-C      . metroNIDAZOLE (METROGEL) 0.75 % gel 1 application  1 application Topical BID Stephani Police, PA-C      . [START ON 12/12/2014] potassium chloride (K-DUR) CR tablet 10 mEq  10 mEq Oral Daily Stephani Police, PA-C      . senna-docusate (Senokot-S) tablet 1 tablet  1 tablet Oral QHS PRN Stephani Police, PA-C      . senna-docusate (Senokot-S) tablet 1 tablet  1 tablet Oral QHS Tora Kindred York, PA-C      . traMADol (ULTRAM) tablet 50 mg  50 mg Oral Q6H PRN Stephani Police, PA-C         ROS:                                                                                                                                       History obtained from unobtainable from patient due to mental status  General ROS: negative for - chills, fatigue, fever, night sweats, weight gain or weight loss Psychological ROS: negative for - behavioral disorder, hallucinations, memory difficulties, mood swings or suicidal ideation Ophthalmic ROS: negative for - blurry vision, double vision, eye pain or loss of vision ENT ROS: negative for -  epistaxis, nasal discharge, oral lesions, sore throat, tinnitus or vertigo Allergy and Immunology ROS: negative for - hives or itchy/watery eyes Hematological and Lymphatic ROS: negative for - bleeding problems, bruising or swollen lymph nodes Endocrine ROS: negative for - galactorrhea, hair pattern changes, polydipsia/polyuria or temperature intolerance Respiratory ROS: negative for - cough, hemoptysis, shortness of breath or wheezing Cardiovascular ROS: negative for - chest pain, dyspnea on exertion, edema or irregular heartbeat Gastrointestinal ROS: negative for - abdominal pain, diarrhea, hematemesis, nausea/vomiting or stool incontinence Genito-Urinary ROS: negative for - dysuria, hematuria, incontinence or urinary frequency/urgency Musculoskeletal ROS: negative for - joint swelling or muscular weakness Neurological ROS: as noted in HPI Dermatological ROS: negative for rash and skin lesion changes   Blood pressure 179/65, pulse 69, temperature 98 F (36.7 C), temperature source Axillary, resp. rate 16, SpO2 99 %.   Neurologic Examination:                                                                                                      HEENT-  Normocephalic, no  lesions, without obvious abnormality.  Normal external eye and conjunctiva.   Neurological Examination Mental Status:Drowsy and difficult to arouse,  unable to assess orientation.   she mumbles softly incomprehensible speech,  unable to assess for aphasia.   does not follow commands due to altered mental status  Cranial Nerves: II:  unable to assess Visual fields, pupils equal, round, III,IV, VI:  extra-ocular motions  grossly appear intact bilaterally as she looked in either gaze.  V,VII:Facial grimace appears symmetric. There is a masked facies noted    Motor / sensory: Moderate rigidity in bilateral upper and lower extremities is noted. She withdraws to stimulus in all 4 extremities does not follow command for an visual motor testing  Deep Tendon Reflexes: 1+  in upper extremities and absent in lower extremities Plantars: Right: downgoing   Left: downgoing Cerebellar: Unable to assess Gait: Deferred, unable to assess     Lab Results: Basic Metabolic Panel: No results for input(s): NA, K, CL, CO2, GLUCOSE, BUN, CREATININE, CALCIUM, MG, PHOS in the last 168 hours.  Liver Function Tests: No results for input(s): AST, ALT, ALKPHOS, BILITOT, PROT, ALBUMIN in the last 168 hours. No results for input(s): LIPASE, AMYLASE in the last 168 hours. No results for input(s): AMMONIA in the last 168 hours.  CBC: No results for input(s): WBC, NEUTROABS, HGB, HCT, MCV, PLT in the last 168 hours.  Cardiac Enzymes: No results for input(s): CKTOTAL, CKMB, CKMBINDEX, TROPONINI in the last 168 hours.  Lipid Panel: No results for input(s): CHOL, TRIG, HDL, CHOLHDL, VLDL, LDLCALC in the last 168 hours.  CBG: No results for input(s): GLUCAP in the last 168 hours.  Microbiology: Results for orders placed or performed during the hospital encounter of 10/12/11  Surgical pcr screen     Status: None   Collection Time: 10/12/11  4:35 PM  Result Value Ref Range Status   MRSA, PCR NEGATIVE NEGATIVE Final    Staphylococcus aureus NEGATIVE NEGATIVE Final    Comment:  The Xpert SA Assay (FDA approved for NASAL specimens in patients over 52 years of age), is one component of a comprehensive surveillance program.  Test performance has been validated by [employer] for patients greater than or equal to 8 year old. It is not intended to diagnose infection nor to guide or monitor treatment.    Coagulation Studies: No results for input(s): LABPROT, INR in the last 72 hours.  Imaging: She had a CT of the brain done the outside facility and her available notes, is a suspicious hypodensity in the cerebellum sister for possible infarct. I do not have images for review. A CD of the CT images was sent with the patient's file but could not open the CD for review.    Assessment/Plan:  81 year old female patient with baseline severe dementia, dependent for her ADLs, lives at a nursing facility, nonambulatory at baseline, family reported being nonverbal and she sits in her wheelchair for most part of the day at her baseline. They reported that she is able to feed herself. She had a first time ever generalized tonic-clonic seizure this morning. History of bilateral subdural hematomas with a left frontal craniotomy in 2013. I recommend obtaining MRI of the brain to further evaluate for intracranial pathology. We will also obtain an EEG tomorrow morning. She will be started on Keppra 500 mg twice a day. She is very drowsy and poorly responsive, likely in postictal state. She also has evidence for Parkinson's disease clinically. Discussed her current neurological assessment, neurodiagnostic evaluation and prognosis in detail with her 3 daughters, answered several of their questions to their satisfaction. Neurology service will continue to follow up during the hospitalization. please call for any further questions.

## 2014-12-11 NOTE — Progress Notes (Addendum)
Patient transferred from [facility].  Lives in SNF and is [facility] and speaks "gibberish" at baseline.  She also has contractures.  She was transferred here at request of neuro for seizures vs CVA.  At 6:30 this AM had tonic clonic type movements.  Got CPR for no pulse although when EMS arrived, she had no signs of previous arrest.  Loaded with phosphyntoin per neuro.  Mental status improved in ER.   Notify neuro when she arrives [facility]: please call flow manager when patient arrives: [phone]

## 2014-12-11 NOTE — H&P (Signed)
[facility] History and Physical                                                                                    [name], is a 81 y.o. female  MRN: [id number]   DOB - 09-Sep-1926  Admit Date - 12/11/2014  Outpatient Primary MD for the patient is Kem Boroughs, MD  Referring Physician:  [facility].  Chief Complaint:  Episode of tonic-clonic movement this a.m.    HPI  [name]  is a 81 y.o. female, with rheumatoid arthritis, advanced dementia, hypertension who is wheelchair-bound and lives at [facility] in [place]. 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  She presents to [facility] as a direct admit from [facility] after having a seizure and receiving CPR at her assisted living facility this morning. Per her daughter's report the patient was found at 6:30 AM having tonic clonic movements. Her blood pressure was very high at that time (greater than 200).   Shortly after that she was found to be pulseless and apneic and received CPR, but no ACLS drugs or intubation. She was then transferred to [facility] CT head revealed possible stroke. She was sent to [facility] is a direct admit for neurology consultation.  At baseline the patient sits in a wheelchair, is awake, will speak gibberish, is able to feed herself using her fingers. She normally recognizes her daughters, but is not able to carry on conversation. Since the episode this morning she has been less responsive, lethargic, and unable to sit up. Her 3 daughters are jointly her POA. They would appreciate neurologic evaluation and workup, but they want to be reasonable and not overly aggressive with her mother's care. She is a DO NOT RESUSCITATE.  Unable to obtain review of systems due to advanced dementia and lethargy.  Past Medical History  Past Medical History  Diagnosis Date  . Subdural hematoma   . Hypertension   . Mitral valvular prolapse    . CVA (cerebrovascular accident)   . Rheumatoid arthritis(714.0)   . Degenerative disc disease     Past Surgical History  Procedure Laterality Date  . Ines Bloomer hole  09/01/2011    Procedure: Ezekiel Ina;  Surgeon: Tia Alert, MD;  Location: [facility];  Service: Neurosurgery;  Laterality: Left;  Left Burr Holes   . Sigmoidcolectomy      Social History Social History  Substance Use Topics  . Smoking status: Never Smoker   . Smokeless tobacco: Never Used  . Alcohol Use: No   wheelchair-bound, advanced dementia, lives at assisted living.  Family History No family history on file. unable to obtain as patient is severely demented.  Prior to Admission medications   Medication Sig Start Date End Date Taking? Authorizing Provider  levETIRAcetam (KEPPRA) 250 MG tablet Take 1 tablet (250 mg total) by mouth 2 (two) times daily. 10/17/11   Tia Alert, MD  levothyroxine (SYNTHROID, LEVOTHROID) 75 MCG tablet Take 75 mcg by mouth daily.    Historical Provider, MD  lisinopril (PRINIVIL,ZESTRIL) 5 MG tablet Take 1 tablet (5 mg total) by mouth daily. 09/15/11 09/14/12  Mcarthur Rossetti Angiulli, PA-C  triamterene-hydrochlorothiazide (MAXZIDE-25) 37.5-25 MG per tablet Take 1 tablet by mouth 3 (three) times a week.    Historical Provider, MD    Allergies  Allergen Reactions  . Sulfa Antibiotics   . Sulfur     Physical Exam  Vitals  Blood pressure 179/65, pulse 69, temperature 98 F (36.7 C), temperature source Axillary, resp. rate 16, SpO2 99 %.   General: Elderly, frail, demented female, chronically ill appearing, with contracted hand, lying in bed in NAD, daughter is at bedside  Psych:  Patient is awake. She responds to my questions with gibberish. She is unable to follow commands.  Neuro:   Lethargic, awake, speaking gibberish, unable to follow commands  ENT: Lips and tongue are dry. Nose shows no drainage or erythema  Neck:  Supple, No lymphadenopathy appreciated  Respiratory:  Patient  does not follow commands to inspire deeply, but no wheezes or crackles heard.  Cardiac:  RRR, No Murmurs, no LE edema noted, no JVD.    Abdomen:  Positive bowel sounds, Soft, Non tender, Non distended,  No masses appreciated  Skin:  No Cyanosis, Normal Skin Turgor, No Skin Rash or Bruise.  Extremities:  1 contracted hand, no effusions.  Data Review  Wt Readings from Last 3 Encounters:  10/13/11 44.6 kg (98 lb 5.2 oz)  10/11/11 45.813 kg (101 lb)  09/15/11 44 kg (97 lb)      Lab Results  Component Value Date   HGBA1C 5.6 09/08/2011    Imaging results:  MR brain pending  EKG: Pending   Assessment & Plan  Principal Problem:   Seizures (HCC) Active Problems:   Rheumatoid arthritis (HCC)   Hypertension   Seizure (HCC)   E-coli UTI   Dementia   Wheelchair bound   Seizures with concern for possible stroke Neurology has seen and evaluated patient and discussed the plan with her family We will obtain an MR brain and an EEG without doing a full stroke workup. The family does not want aggressive care. They want to appropriately treat her mother but are focused more on her comfort. We will check CBC, bmet, fasting lipid, hemoglobin A1c. Neurology recommended an increased dose of Keppra which was ordered IV.  The family refused the Keppra as they felt it caused the patient to be lethargic.  Hypertension Will hold oral hypertensive medications to allow for permissive hypertension in case of stroke. Will place an order for hydralazine PRN for SBP greater than 200.  Escherichia coli UTI Patient was diagnosed with a UTI several days ago and was started on Cipro. I have requested her culture and sensitivity results be obtained from the assisted living facility.  Repeat UA and culture have been ordered. The patient will be placed on IV Cipro.  Dementia Advanced.  Stable.  Rheumatoid arthritis I do not see any home medications for this. Presumed  stable.  Hypothyroidism Continue Synthroid IV.    Consultants Called:    Neurology  Family Communication:     3 daughters at bedside.  Together they are POA  Code Status:    DNR  Condition:    Guarded.  Potential Disposition:   To SNF when appropriate.  24 - 48 hours.  Time spent in minutes : [address]   [facility] Algis Downs,  [place] on 12/11/2014 at 4:16 PM Between 7am to 7pm - Pager - [phone] After 7pm go to [email or web address] - password [email or web address] And look for the night coverage person covering me after  hours

## 2014-12-12 ENCOUNTER — Observation Stay (HOSPITAL_COMMUNITY): Payer: Medicare Other

## 2014-12-12 DIAGNOSIS — Z23 Encounter for immunization: Secondary | ICD-10-CM | POA: Diagnosis not present

## 2014-12-12 DIAGNOSIS — G4089 Other seizures: Secondary | ICD-10-CM | POA: Diagnosis present

## 2014-12-12 DIAGNOSIS — N390 Urinary tract infection, site not specified: Secondary | ICD-10-CM | POA: Diagnosis not present

## 2014-12-12 DIAGNOSIS — M069 Rheumatoid arthritis, unspecified: Secondary | ICD-10-CM | POA: Diagnosis present

## 2014-12-12 DIAGNOSIS — R569 Unspecified convulsions: Secondary | ICD-10-CM | POA: Diagnosis not present

## 2014-12-12 DIAGNOSIS — F0390 Unspecified dementia without behavioral disturbance: Secondary | ICD-10-CM | POA: Diagnosis not present

## 2014-12-12 DIAGNOSIS — Z993 Dependence on wheelchair: Secondary | ICD-10-CM | POA: Diagnosis not present

## 2014-12-12 DIAGNOSIS — Z8673 Personal history of transient ischemic attack (TIA), and cerebral infarction without residual deficits: Secondary | ICD-10-CM | POA: Diagnosis not present

## 2014-12-12 DIAGNOSIS — E876 Hypokalemia: Secondary | ICD-10-CM | POA: Diagnosis not present

## 2014-12-12 DIAGNOSIS — B9620 Unspecified Escherichia coli [E. coli] as the cause of diseases classified elsewhere: Secondary | ICD-10-CM | POA: Diagnosis not present

## 2014-12-12 DIAGNOSIS — I10 Essential (primary) hypertension: Secondary | ICD-10-CM | POA: Diagnosis not present

## 2014-12-12 DIAGNOSIS — Z66 Do not resuscitate: Secondary | ICD-10-CM | POA: Diagnosis present

## 2014-12-12 DIAGNOSIS — E039 Hypothyroidism, unspecified: Secondary | ICD-10-CM | POA: Diagnosis present

## 2014-12-12 DIAGNOSIS — R4182 Altered mental status, unspecified: Secondary | ICD-10-CM | POA: Diagnosis present

## 2014-12-12 DIAGNOSIS — I119 Hypertensive heart disease without heart failure: Secondary | ICD-10-CM | POA: Diagnosis present

## 2014-12-12 LAB — BASIC METABOLIC PANEL
Anion gap: 7 (ref 5–15)
BUN: 6 mg/dL (ref 6–20)
CO2: 27 mmol/L (ref 22–32)
CREATININE: 0.6 mg/dL (ref 0.44–1.00)
Calcium: 8.3 mg/dL — ABNORMAL LOW (ref 8.9–10.3)
Chloride: 103 mmol/L (ref 101–111)
GFR calc Af Amer: >60 mL/min (ref >60)
GLUCOSE: 146 mg/dL — AB (ref 65–99)
Potassium: 3.3 mmol/L — ABNORMAL LOW (ref 3.5–5.1)
SODIUM: 137 mmol/L (ref 135–145)

## 2014-12-12 LAB — LIPID PANEL
CHOL/HDL RATIO: 5 ratio
CHOLESTEROL: 139 mg/dL (ref 0–200)
HDL: 28 mg/dL — ABNORMAL LOW (ref >40)
LDL Cholesterol: 96 mg/dL (ref 0–99)
Triglycerides: 76 mg/dL (ref <150)
VLDL: 15 mg/dL (ref 0–40)

## 2014-12-12 LAB — URINE MICROSCOPIC-ADD ON

## 2014-12-12 LAB — CBC
HCT: 35.6 % — ABNORMAL LOW (ref 36.0–46.0)
Hemoglobin: 11.5 g/dL — ABNORMAL LOW (ref 12.0–15.0)
MCH: 29.3 pg (ref 26.0–34.0)
MCHC: 32.3 g/dL (ref 30.0–36.0)
MCV: 90.8 fL (ref 78.0–100.0)
PLATELETS: 208 10*3/uL (ref 150–400)
RBC: 3.92 MIL/uL (ref 3.87–5.11)
RDW: 13.6 % (ref 11.5–15.5)
WBC: 7.5 10*3/uL (ref 4.0–10.5)

## 2014-12-12 LAB — URINALYSIS, ROUTINE W REFLEX MICROSCOPIC
BILIRUBIN URINE: NEGATIVE
GLUCOSE, UA: NEGATIVE mg/dL
KETONES UR: NEGATIVE mg/dL
NITRITE: NEGATIVE
PH: 7 (ref 5.0–8.0)
Protein, ur: NEGATIVE mg/dL
SPECIFIC GRAVITY, URINE: 1.015 (ref 1.005–1.030)

## 2014-12-12 MED ORDER — DEXTROSE 5 % IV SOLN
1.0000 g | INTRAVENOUS | Status: DC
  Administered 2014-12-12 – 2014-12-15 (×4): 1 g via INTRAVENOUS
  Filled 2014-12-12 (×4): qty 10

## 2014-12-12 MED ORDER — POTASSIUM CHLORIDE 10 MEQ/100ML IV SOLN
10.0000 meq | INTRAVENOUS | Status: AC
  Administered 2014-12-12 (×2): 10 meq via INTRAVENOUS
  Filled 2014-12-12 (×2): qty 100

## 2014-12-12 MED ORDER — MAGNESIUM OXIDE 400 (241.3 MG) MG PO TABS
200.0000 mg | ORAL_TABLET | Freq: Every day | ORAL | Status: DC
  Filled 2014-12-12: qty 1

## 2014-12-12 NOTE — Progress Notes (Addendum)
PROGRESS NOTE  [name] KWI:[id number] DOB: 12/25/1926 DOA: 12/11/2014 PCP: Kem Boroughs, MD  Assessment/Plan: Seizures with concern for possible stroke Neurology has seen and evaluated patient and discussed the plan with her family MRI negative +EEG per neuro The family does not want aggressive care. They want to appropriately treat her mother but are focused more on her comfort.  Neurology recommended an increased dose of Keppra which was ordered IV.  Nursing reports family refused keppra--- will need to meet and discuss with family GOC as patient has poor prognosis  Hypertension Will hold oral hypertensive medications to allow for permissive hypertension in case of stroke. Will place an order for hydralazine PRN for SBP greater than 200.  Escherichia coli UTI Patient was diagnosed with a UTI several days ago and was started on Cipro. I have requested her culture and sensitivity results be obtained from the assisted living facility. Repeat UA and culture have been ordered. The patient will be placed on IV Cipro.  Dementia Advanced. Stable.  Rheumatoid arthritis I do not see any home medications for this. Presumed stable.  Hypothyroidism Continue Synthroid IV.  Code Status: DNR Family Communication: no family at bedside-- should be coming this PM for meeting Disposition Plan: poor overall prognosis   Consultants:  neuro  Procedures:  EEG    HPI/Subjective: unresponsive  Objective: Filed Vitals:   12/12/14 1029  BP: 158/67  Pulse: 67  Temp: 99.2 F (37.3 C)  Resp: 17    Intake/Output Summary (Last 24 hours) at 12/12/14 1223 Last data filed at 12/12/14 0536  Gross per 24 hour  Intake      0 ml  Output    700 ml  Net   -700 ml   There were no vitals filed for this visit.  Exam:   General:  sleeping  Cardiovascular: rrr  Respiratory: clear  Abdomen: +BS, soft  Musculoskeletal: moved upper extremitiy but not following commands  Data  Reviewed: Basic Metabolic Panel:  Recent Labs Lab 12/11/14 1913 12/12/14 0442  NA 139 137  K 3.5 3.3*  CL 104 103  CO2 28 27  GLUCOSE 170* 146*  BUN 6 6  CREATININE 0.55 0.60  CALCIUM 8.6* 8.3*   Liver Function Tests: No results for input(s): AST, ALT, ALKPHOS, BILITOT, PROT, ALBUMIN in the last 168 hours. No results for input(s): LIPASE, AMYLASE in the last 168 hours. No results for input(s): AMMONIA in the last 168 hours. CBC:  Recent Labs Lab 12/11/14 1913 12/12/14 0442  WBC 8.9 7.5  HGB 12.2 11.5*  HCT 37.8 35.6*  MCV 91.1 90.8  PLT 218 208   Cardiac Enzymes: No results for input(s): CKTOTAL, CKMB, CKMBINDEX, TROPONINI in the last 168 hours. BNP (last 3 results) No results for input(s): BNP in the last 8760 hours.  ProBNP (last 3 results) No results for input(s): PROBNP in the last 8760 hours.  CBG:  Recent Labs Lab 12/11/14 1905  GLUCAP 148*    No results found for this or any previous visit (from the past 240 hour(s)).   Studies: Mr Brain Wo Contrast  12/11/2014  CLINICAL DATA:  Stroke.  Altered mental status, dementia. EXAM: MRI HEAD WITHOUT CONTRAST TECHNIQUE: Multiplanar, multiecho pulse sequences of the brain and surrounding structures were obtained without intravenous contrast. COMPARISON:  CT head 10/27/2011 FINDINGS: Progressive enlargement of the ventricles which are now moderately dilated. Previously there were bilateral subdural hematomas with effacement of the subarachnoid space. Subarachnoid spaces are more prominent on today's study. It is possible  this is related to progressive atrophy however I think there is more likely an element of communicating hydrocephalus present. This may represent normal pressure hydrocephalus. Negative for acute infarct. Mild chronic microvascular ischemic change in the white matter. Chronic lesion in the right brachium pontis most likely is an area of chronic ischemia. Brainstem and cerebellum intact. Negative for  mass lesion. Small areas of chronic micro hemorrhage in the right occipital parietal lobe and left parietal lobe. Interval resolution of bilateral subdural hematomas. IMPRESSION: Negative for acute infarct. Mild to moderate chronic microvascular ischemic change in the white matter. Ventricles are moderately large. There is an element of atrophy however the ventricles are larger than expected for atrophy raising the possibility of normal pressure hydrocephalus. Electronically Signed   By: Marlan Palau M.D.   On: 12/11/2014 20:53   Dg Chest Port 1 View  12/11/2014  CLINICAL DATA:  Aspiration. EXAM: PORTABLE CHEST 1 VIEW COMPARISON:  08/31/2011 FINDINGS: Cardiomediastinal silhouette is stably enlarged. Mediastinal contours appear intact. There is right peritracheal thickening with uncertain etiology. Rounded density overlying the lower mid thorax may represent a hiatal hernia. There is no evidence of focal airspace consolidation, pleural effusion or pneumothorax. Chronic interstitial lung disease is noted. Osseous structures are without acute abnormality. Soft tissues are grossly normal. IMPRESSION: No evidence of focal airspace consolidation. Chronic interstitial lung disease. Stable cardiomegaly. Right peritracheal soft tissue thickening with uncertain etiology, possibly artifactual. Repeated PA and lateral radiograph of the chest may be considered. Electronically Signed   By: Ted Mcalpine M.D.   On: 12/11/2014 22:37    Scheduled Meds: .  stroke: mapping our early stages of recovery book   Does not apply Once  . ciprofloxacin  400 mg Intravenous Q12H  . heparin  5,000 Units Subcutaneous 3 times per day  . levETIRAcetam  500 mg Intravenous Q12H  . levothyroxine  25 mcg Intravenous Daily  . metroNIDAZOLE  1 application Topical BID   Continuous Infusions:  Antibiotics Given (last 72 hours)    Date/Time Action Medication Dose Rate   12/11/14 2133 Given   ciprofloxacin (CIPRO) IVPB 400 mg 400  mg 200 mL/hr   12/12/14 9211 Given   ciprofloxacin (CIPRO) IVPB 400 mg 400 mg 200 mL/hr      Principal Problem:   Seizures (HCC) Active Problems:   Rheumatoid arthritis (HCC)   Hypertension   Seizure (HCC)   E-coli UTI   Dementia   Wheelchair bound    Time spent: 25 min    Purnell Daigle [facility]  [facility] Pager [phone]. If 7PM-7AM, please contact night-coverage at [email or web address], password [facility] 12/12/2014, 12:23 PM  LOS: 1 day

## 2014-12-12 NOTE — Progress Notes (Signed)
EEG Completed; Results Pending  

## 2014-12-12 NOTE — Progress Notes (Signed)
Subjective: Patient admitted to mental status. She is more awake today. Does not follow commands consistently, is not oriented. Does not appear to be in any distress.   Objective: Current vital signs: BP 185/44 mmHg  Pulse 70  Temp(Src) 98.1 F (36.7 C) (Axillary)  Resp 18  SpO2 95% Vital signs in last 24 hours: Temp:  [98 F (36.7 C)-99.2 F (37.3 C)] 98.1 F (36.7 C) (11/19 1801) Pulse Rate:  [66-72] 70 (11/19 1801) Resp:  [14-18] 18 (11/19 1801) BP: (121-185)/(44-99) 185/44 mmHg (11/19 1801) SpO2:  [95 %-98 %] 95 % (11/19 1801)  Intake/Output from previous day: 11/18 0701 - 11/19 0700 In: -  Out: 700 [Urine:700] Intake/Output this shift:   Nutritional status: Diet NPO time specified  Neurologic Exam: Patient appears awake, alert but is not oriented does not follow commands, mumbles incomprehensible speech. No specific gaze deviation noted, cranial nerve exam 2-12 grossly intact. Increased tone rigidity in all extremities, able to demonstrate antigravity strength in bilateral upper extremities,  symmetric motor movement in the left low lower extremities.   Lab Results: Basic Metabolic Panel:  Recent Labs Lab 12/11/14 1913 12/12/14 0442  NA 139 137  K 3.5 3.3*  CL 104 103  CO2 28 27  GLUCOSE 170* 146*  BUN 6 6  CREATININE 0.55 0.60  CALCIUM 8.6* 8.3*    Liver Function Tests: No results for input(s): AST, ALT, ALKPHOS, BILITOT, PROT, ALBUMIN in the last 168 hours. No results for input(s): LIPASE, AMYLASE in the last 168 hours. No results for input(s): AMMONIA in the last 168 hours.  CBC:  Recent Labs Lab 12/11/14 1913 12/12/14 0442  WBC 8.9 7.5  HGB 12.2 11.5*  HCT 37.8 35.6*  MCV 91.1 90.8  PLT 218 208    Cardiac Enzymes: No results for input(s): CKTOTAL, CKMB, CKMBINDEX, TROPONINI in the last 168 hours.  Lipid Panel:  Recent Labs Lab 12/12/14 0442  CHOL 139  TRIG 76  HDL 28*  CHOLHDL 5.0  VLDL 15  LDLCALC 96    CBG:  Recent  Labs Lab 12/11/14 1905  GLUCAP 148*    Microbiology: Results for orders placed or performed during the hospital encounter of 10/12/11  Surgical pcr screen     Status: None   Collection Time: 10/12/11  4:35 PM  Result Value Ref Range Status   MRSA, PCR NEGATIVE NEGATIVE Final   Staphylococcus aureus NEGATIVE NEGATIVE Final    Comment:        The Xpert SA Assay (FDA approved for NASAL specimens in patients over 97 years of age), is one component of a comprehensive surveillance program.  Test performance has been validated by [employer] for patients greater than or equal to 67 year old. It is not intended to diagnose infection nor to guide or monitor treatment.    Coagulation Studies: No results for input(s): LABPROT, INR in the last 72 hours.  Imaging: Mr Sherrin Daisy Contrast  12/11/2014  CLINICAL DATA:  Stroke.  Altered mental status, dementia. EXAM: MRI HEAD WITHOUT CONTRAST TECHNIQUE: Multiplanar, multiecho pulse sequences of the brain and surrounding structures were obtained without intravenous contrast. COMPARISON:  CT head 10/27/2011 FINDINGS: Progressive enlargement of the ventricles which are now moderately dilated. Previously there were bilateral subdural hematomas with effacement of the subarachnoid space. Subarachnoid spaces are more prominent on today's study. It is possible this is related to progressive atrophy however I think there is more likely an element of communicating hydrocephalus present. This may represent normal pressure hydrocephalus.  Negative for acute infarct. Mild chronic microvascular ischemic change in the white matter. Chronic lesion in the right brachium pontis most likely is an area of chronic ischemia. Brainstem and cerebellum intact. Negative for mass lesion. Small areas of chronic micro hemorrhage in the right occipital parietal lobe and left parietal lobe. Interval resolution of bilateral subdural hematomas. IMPRESSION: Negative for acute infarct.  Mild to moderate chronic microvascular ischemic change in the white matter. Ventricles are moderately large. There is an element of atrophy however the ventricles are larger than expected for atrophy raising the possibility of normal pressure hydrocephalus. Electronically Signed   By: Marlan Palau M.D.   On: 12/11/2014 20:53   Dg Chest Port 1 View  12/11/2014  CLINICAL DATA:  Aspiration. EXAM: PORTABLE CHEST 1 VIEW COMPARISON:  08/31/2011 FINDINGS: Cardiomediastinal silhouette is stably enlarged. Mediastinal contours appear intact. There is right peritracheal thickening with uncertain etiology. Rounded density overlying the lower mid thorax may represent a hiatal hernia. There is no evidence of focal airspace consolidation, pleural effusion or pneumothorax. Chronic interstitial lung disease is noted. Osseous structures are without acute abnormality. Soft tissues are grossly normal. IMPRESSION: No evidence of focal airspace consolidation. Chronic interstitial lung disease. Stable cardiomegaly. Right peritracheal soft tissue thickening with uncertain etiology, possibly artifactual. Repeated PA and lateral radiograph of the chest may be considered. Electronically Signed   By: Ted Mcalpine M.D.   On: 12/11/2014 22:37    Medications:  Scheduled: .  stroke: mapping our early stages of recovery book   Does not apply Once  . cefTRIAXone (ROCEPHIN)  IV  1 g Intravenous Q24H  . heparin  5,000 Units Subcutaneous 3 times per day  . levothyroxine  25 mcg Intravenous Daily  . metroNIDAZOLE  1 application Topical BID   Continuous:  SLH:[id number], hydrALAZINE  Assessment/Plan:   81 year old female patient with advanced dementia, wheelchair-bound, nonverbal and nonambulatory at baseline. She also has history of bilateral subdural hematomas in 2013 with a left craniotomy. Admitted after having a seizure in a nursing facility. Her daughters reported that she had a UTI a few days ago and was started on  ciprofloxacin which I suspect could increase the risk for seizures. She is currently on ciprofloxacin and I requested Dr. Benjamine Mola to switch to a different antibiotic. The EEG was abnormal with intermittent abnormal epileptiform discharges in the left frontal region, with generalized background slowing and GPED's suggestive of at least moderate encephalopathy. No evidence of electrographic seizures were seen. I had an extensive discussion with the patient's 2 daughters at bedside about her current assessment, EEG results and her risk for seizures. Although she was started on Keppra yesterday, last night her daughters requested not to give the Keppra due to sedation. They're very happy to see her more awake and alert this morning off of the medications. We discussed about her increased seizure risk if she is not being treated with any antiepileptic medication but they prefer her to stay more awake and alert without the sedating medications at this time and is willing to accept the seizure risk associated with that. We also discussed about long-term care goals. Patient has failed swallow evaluation. The 2 daughters are going to discuss with other family members about long-term care goals, especially regarding PEG tube placement versus palliative care if patient continues to have increased aspiration risk over the next few days. Several of their questions have been answered to their satisfaction , reviewed the patient's brain MRI images with them . MRI did not  show any acute intracranial pathology or an acute infarct.

## 2014-12-12 NOTE — Evaluation (Signed)
Clinical/Bedside Swallow Evaluation Patient Details  Name: [name] MRN: [id number] Date of Birth: 1926/07/25  Today's Date: 12/12/2014 Time: SLP Start Time (ACUTE ONLY): 0973 SLP Stop Time (ACUTE ONLY): 0844 SLP Time Calculation (min) (ACUTE ONLY): 23 min  Past Medical History:  Past Medical History  Diagnosis Date  . Subdural hematoma   . Hypertension   . Mitral valvular prolapse   . CVA (cerebrovascular accident)   . Rheumatoid arthritis(714.0)   . Degenerative disc disease    Past Surgical History:  Past Surgical History  Procedure Laterality Date  . Ines Bloomer hole  09/01/2011    Procedure: Ezekiel Ina;  Surgeon: Tia Alert, MD;  Location: [facility];  Service: Neurosurgery;  Laterality: Left;  Left Burr Holes   . Sigmoidcolectomy     HPI:  Pt is a 81 y.o. female, with rheumatoid arthritis, advanced dementia, hypertension who is wheelchair-bound and lives at [facility] in [place]. Pt presents to [facility] after having a seizure and receiving CPR at her ALF. MRI of brain negative for acute infarct.    Assessment / Plan / Recommendation Clinical Impression  Pt presents with a primarily cognitively based oropharyngeal dysphagia. Noted lethargy and decreased alertness despite multimodal cueing (thermal stimulation, sternal rub, etc.) Pt some garbled speech, yet unable to open eyes. Oral cavity with xerostomia, which improved with oral care. Pt responsive to PO trials yet displayed reduced airway protection with thin liquids with  immediate weak reflexive coughing.  Further trials of puree and nectar thick liquids pt exhibited poor bolus awareness and suspected delay in swallow initiation. Given poor mentation, recommend continue NPO as aspiration risk remains high. ST to closely monitor for PO readiness. Anticipate as mentation improves pt will be able to tolerate diet implementation.       Aspiration Risk  Moderate aspiration risk    Diet Recommendation      Medication Administration: Via alternative means    Other  Recommendations Oral Care Recommendations: Oral care QID   Follow up Recommendations       Frequency and Duration min 2x/week  1 week       Swallow Study   General Date of Onset: 12/11/14 HPI: Pt is a 81 y.o. female, with rheumatoid arthritis, advanced dementia, hypertension who is wheelchair-bound and lives at [facility] in [place]. Pt presents to [facility] after having a seizure and receiving CPR at her ALF. MRI of brain negative for acute infarct.  Type of Study: Bedside Swallow Evaluation Diet Prior to this Study: NPO Temperature Spikes Noted: No Respiratory Status: Room air History of Recent Intubation: No Behavior/Cognition: Lethargic/Drowsy;Doesn't follow directions Oral Cavity Assessment: Dry;Dried secretions Oral Care Completed by SLP: Yes Oral Cavity - Dentition: Adequate natural dentition Self-Feeding Abilities: Total assist Patient Positioning: Upright in bed Baseline Vocal Quality: Low vocal intensity Volitional Cough: Cognitively unable to elicit Volitional Swallow: Unable to elicit    Oral/Motor/Sensory Function Overall Oral Motor/Sensory Function: Generalized oral weakness   Ice Chips Ice chips: Impaired Presentation: Spoon Oral Phase Impairments: Reduced lingual movement/coordination;Poor awareness of bolus Oral Phase Functional Implications: Prolonged oral transit Pharyngeal Phase Impairments: Suspected delayed Swallow   Thin Liquid Thin Liquid: Impaired Presentation: Spoon Oral Phase Impairments: Poor awareness of bolus Oral Phase Functional Implications: Prolonged oral transit Pharyngeal  Phase Impairments: Suspected delayed Swallow;Cough - Immediate;Multiple swallows    Nectar Thick Nectar Thick Liquid: Impaired Presentation: Spoon Oral Phase Impairments: Poor awareness of bolus Oral phase functional implications: Prolonged oral transit Pharyngeal Phase  Impairments:  Suspected delayed Swallow   Honey Thick Honey Thick Liquid: Not tested   Puree Puree: Impaired Oral Phase Impairments: Poor awareness of bolus Oral Phase Functional Implications: Prolonged oral transit Pharyngeal Phase Impairments: Suspected delayed Swallow   Solid Solid: Not tested      Marcene Duos MA, CCC-SLP Acute Care Speech Language Pathologist    Marcene Duos E 12/12/2014,8:58 AM

## 2014-12-12 NOTE — Progress Notes (Signed)
PT Cancellation Note  Patient Details Name: [name] MRN: [id number] DOB: Jan 13, 1927   Cancelled Treatment:    Reason Eval/Treat Not Completed: Medical issues which prohibited therapy Spoke with Dr. Benjamine Mola this morning. Pt not appropriate for PT evaluation at this time and remains on bed rest order. Will continue to follow and provide services as appropriate if/when MD feels [name] is ready for a PT evaluation.  Berton Mount 12/12/2014, 12:38 PM  Sunday Spillers [place], [place] [phone]

## 2014-12-13 LAB — URINE CULTURE: CULTURE: NO GROWTH

## 2014-12-13 MED ORDER — LEVOTHYROXINE SODIUM 25 MCG PO TABS
75.0000 ug | ORAL_TABLET | Freq: Every day | ORAL | Status: DC
  Administered 2014-12-14 – 2014-12-15 (×2): 75 ug via ORAL
  Filled 2014-12-13 (×3): qty 1

## 2014-12-13 MED ORDER — RESOURCE THICKENUP CLEAR PO POWD
ORAL | Status: DC | PRN
  Filled 2014-12-13: qty 125

## 2014-12-13 NOTE — Progress Notes (Signed)
PT Cancellation Note  Patient Details Name: [name] MRN: [id number] DOB: 08-11-1926   Cancelled Treatment:    Reason Eval/Treat Not Completed: PT screened, no needs identified, will sign off. Pt not appropriate for PT intervention. Pt with advanced dementia, unable to follow commands, and nonambulatory, w/c dependent PTA. PT signing off.    Ilda Foil 12/13/2014, 11:36 AM

## 2014-12-13 NOTE — Progress Notes (Addendum)
PROGRESS NOTE  [name] ZOX:[id number] DOB: 1926/12/15 DOA: 12/11/2014 PCP: Kem Boroughs, MD  Assessment/Plan: Seizures with concern for possible stroke MRI negative +EEG per neuro: EEG was abnormal with intermittent abnormal epileptiform discharges in the left frontal region, with generalized background slowing and GPED's suggestive of at least moderate encephalopathy. The family does not want aggressive care. They want to appropriately treat her mother but are focused more on her comfort.  Family does not want keppra - patient has not passed swallow eval-- although patient more awake today so hope she will pass swallow  Hypertension Resume home meds once able to take PO  Escherichia coli UTI  Repeat UA and culture have been ordered Change cipro to rocephin- will d/c after tomm dose  Dementia Advanced. Stable.  Rheumatoid arthritis I do not see any home medications for this. Presumed stable.  Hypothyroidism Continue Synthroid IV.  Hypokalemia -replete  Code Status: DNR Family Communication: called daughters- no answer at either number Disposition Plan: poor overall prognosis   Consultants:  neuro  Procedures:  EEG    HPI/Subjective: More awake, mumbling inconherantly  Objective: Filed Vitals:   12/13/14 0535  BP: 165/77  Pulse: 73  Temp: 98.1 F (36.7 C)  Resp: 15    Intake/Output Summary (Last 24 hours) at 12/13/14 0831 Last data filed at 12/13/14 0147  Gross per 24 hour  Intake    250 ml  Output    500 ml  Net   -250 ml   There were no vitals filed for this visit.  Exam:   General:  Awake, NAD  Cardiovascular: rrr  Respiratory: clear  Abdomen: +BS, soft  Musculoskeletal: moving right arm- not following commands  Data Reviewed: Basic Metabolic Panel:  Recent Labs Lab 12/11/14 1913 12/12/14 0442  NA 139 137  K 3.5 3.3*  CL 104 103  CO2 28 27  GLUCOSE 170* 146*  BUN 6 6  CREATININE 0.55 0.60  CALCIUM 8.6* 8.3*    Liver Function Tests: No results for input(s): AST, ALT, ALKPHOS, BILITOT, PROT, ALBUMIN in the last 168 hours. No results for input(s): LIPASE, AMYLASE in the last 168 hours. No results for input(s): AMMONIA in the last 168 hours. CBC:  Recent Labs Lab 12/11/14 1913 12/12/14 0442  WBC 8.9 7.5  HGB 12.2 11.5*  HCT 37.8 35.6*  MCV 91.1 90.8  PLT 218 208   Cardiac Enzymes: No results for input(s): CKTOTAL, CKMB, CKMBINDEX, TROPONINI in the last 168 hours. BNP (last 3 results) No results for input(s): BNP in the last 8760 hours.  ProBNP (last 3 results) No results for input(s): PROBNP in the last 8760 hours.  CBG:  Recent Labs Lab 12/11/14 1905  GLUCAP 148*    No results found for this or any previous visit (from the past 240 hour(s)).   Studies: Mr Brain Wo Contrast  12/11/2014  CLINICAL DATA:  Stroke.  Altered mental status, dementia. EXAM: MRI HEAD WITHOUT CONTRAST TECHNIQUE: Multiplanar, multiecho pulse sequences of the brain and surrounding structures were obtained without intravenous contrast. COMPARISON:  CT head 10/27/2011 FINDINGS: Progressive enlargement of the ventricles which are now moderately dilated. Previously there were bilateral subdural hematomas with effacement of the subarachnoid space. Subarachnoid spaces are more prominent on today's study. It is possible this is related to progressive atrophy however I think there is more likely an element of communicating hydrocephalus present. This may represent normal pressure hydrocephalus. Negative for acute infarct. Mild chronic microvascular ischemic change in the white matter. Chronic lesion in  the right brachium pontis most likely is an area of chronic ischemia. Brainstem and cerebellum intact. Negative for mass lesion. Small areas of chronic micro hemorrhage in the right occipital parietal lobe and left parietal lobe. Interval resolution of bilateral subdural hematomas. IMPRESSION: Negative for acute infarct.  Mild to moderate chronic microvascular ischemic change in the white matter. Ventricles are moderately large. There is an element of atrophy however the ventricles are larger than expected for atrophy raising the possibility of normal pressure hydrocephalus. Electronically Signed   By: Marlan Palau M.D.   On: 12/11/2014 20:53   Dg Chest Port 1 View  12/11/2014  CLINICAL DATA:  Aspiration. EXAM: PORTABLE CHEST 1 VIEW COMPARISON:  08/31/2011 FINDINGS: Cardiomediastinal silhouette is stably enlarged. Mediastinal contours appear intact. There is right peritracheal thickening with uncertain etiology. Rounded density overlying the lower mid thorax may represent a hiatal hernia. There is no evidence of focal airspace consolidation, pleural effusion or pneumothorax. Chronic interstitial lung disease is noted. Osseous structures are without acute abnormality. Soft tissues are grossly normal. IMPRESSION: No evidence of focal airspace consolidation. Chronic interstitial lung disease. Stable cardiomegaly. Right peritracheal soft tissue thickening with uncertain etiology, possibly artifactual. Repeated PA and lateral radiograph of the chest may be considered. Electronically Signed   By: Ted Mcalpine M.D.   On: 12/11/2014 22:37    Scheduled Meds: .  stroke: mapping our early stages of recovery book   Does not apply Once  . cefTRIAXone (ROCEPHIN)  IV  1 g Intravenous Q24H  . heparin  5,000 Units Subcutaneous 3 times per day  . levothyroxine  25 mcg Intravenous Daily  . metroNIDAZOLE  1 application Topical BID   Continuous Infusions:  Antibiotics Given (last 72 hours)    Date/Time Action Medication Dose Rate   12/11/14 2133 Given   ciprofloxacin (CIPRO) IVPB 400 mg 400 mg 200 mL/hr   12/12/14 6295 Given   ciprofloxacin (CIPRO) IVPB 400 mg 400 mg 200 mL/hr   12/12/14 1636 Given   cefTRIAXone (ROCEPHIN) 1 g in dextrose 5 % 50 mL IVPB 1 g 100 mL/hr      Principal Problem:   Seizures (HCC) Active  Problems:   Rheumatoid arthritis (HCC)   Hypertension   Seizure (HCC)   E-coli UTI   Dementia   Wheelchair bound    Time spent: 25 min    Margretta Zamorano [employer]  [facility] Pager [phone]. If 7PM-7AM, please contact night-coverage at [email or web address], password [facility] 12/13/2014, 8:31 AM  LOS: 2 days

## 2014-12-13 NOTE — Progress Notes (Signed)
Speech Language Pathology Treatment: Dysphagia  Patient Details Name: [name] MRN: [id number] DOB: 03/22/1926 Today's Date: 12/13/2014 Time: 8338-2505 SLP Time Calculation (min) (ACUTE ONLY): 16 min  Assessment / Plan / Recommendation Clinical Impression  Pt wakes with mild-mod verbal and tactile stimuli; keeps eyes closed. Mild labial residue, spill and inconsistent holding due to cognitive challenges and general weakness. Suspect swallow delay. No cough, throat clear or wet vocal quality however intermittent silent aspiration is possible. Per notes, family desires comfort care (no family present). When pt alert, recommend Dys 1 texture and honey thick liquids, no straws, crush pills, sit upright and small bites/sips. Brief ST follow up.   HPI HPI: Pt is a 81 y.o. female, with rheumatoid arthritis, advanced dementia, hypertension who is wheelchair-bound and lives at [facility] in [place]. Pt presents to [facility] after having a seizure and receiving CPR at her ALF. MRI of brain negative for acute infarct.       SLP Plan  Continue with current plan of care     Recommendations  Diet recommendations: Dysphagia 1 (puree);Honey-thick liquid Liquids provided via: Cup;No straw Medication Administration: Crushed with puree Supervision: Full supervision/cueing for compensatory strategies;Staff to assist with self feeding Compensations: Slow rate;Small sips/bites Postural Changes and/or Swallow Maneuvers: Seated upright 90 degrees              Oral Care Recommendations: Oral care BID Follow up Recommendations: None Plan: Continue with current plan of care   Royce Macadamia 12/13/2014, 11:38 AM  Breck Coons Lonell Face.Ed [employer] [phone]

## 2014-12-13 NOTE — Progress Notes (Signed)
Subjective: Patient admitted to mental status.  She remains stable neurologically, awake, opens her eyes to verbal command and looks in either direction. Smiles intermittently.  Does not follow commands consistently, is not oriented. Does not appear to be in any distress.   Objective: Current vital signs: BP 163/52 mmHg  Pulse 62  Temp(Src) 99.4 F (37.4 C) (Oral)  Resp 18  SpO2 94% Vital signs in last 24 hours: Temp:  [97.5 F (36.4 C)-99.4 F (37.4 C)] 99.4 F (37.4 C) (11/20 1729) Pulse Rate:  [61-98] 62 (11/20 1729) Resp:  [14-18] 18 (11/20 1729) BP: (144-175)/(48-89) 163/52 mmHg (11/20 1729) SpO2:  [93 %-96 %] 94 % (11/20 1729)  Intake/Output from previous day: 11/19 0701 - 11/20 0700 In: 650 [IV Piggyback:650] Out: 500 [Urine:500] Intake/Output this shift:   Nutritional status: DIET - DYS 1 Room service appropriate?: Yes; Fluid consistency:: Honey Thick  Neurologic Exam: Patient appears awake, alert but is not oriented does not follow commands, mumbles incomprehensible speech. No specific gaze deviation noted, cranial nerve exam 2-12 grossly intact. Increased tone rigidity in all extremities, able to demonstrate antigravity strength in bilateral upper extremities,  symmetric motor movement in the lower extremities.   Lab Results: Basic Metabolic Panel:  Recent Labs Lab 12/11/14 1913 12/12/14 0442  NA 139 137  K 3.5 3.3*  CL 104 103  CO2 28 27  GLUCOSE 170* 146*  BUN 6 6  CREATININE 0.55 0.60  CALCIUM 8.6* 8.3*    Liver Function Tests: No results for input(s): AST, ALT, ALKPHOS, BILITOT, PROT, ALBUMIN in the last 168 hours. No results for input(s): LIPASE, AMYLASE in the last 168 hours. No results for input(s): AMMONIA in the last 168 hours.  CBC:  Recent Labs Lab 12/11/14 1913 12/12/14 0442  WBC 8.9 7.5  HGB 12.2 11.5*  HCT 37.8 35.6*  MCV 91.1 90.8  PLT 218 208    Cardiac Enzymes: No results for input(s): CKTOTAL, CKMB, CKMBINDEX, TROPONINI  in the last 168 hours.  Lipid Panel:  Recent Labs Lab 12/12/14 0442  CHOL 139  TRIG 76  HDL 28*  CHOLHDL 5.0  VLDL 15  LDLCALC 96    CBG:  Recent Labs Lab 12/11/14 1905  GLUCAP 148*    Microbiology: Results for orders placed or performed during the hospital encounter of 12/11/14  Culture, Urine     Status: None   Collection Time: 12/12/14  6:57 AM  Result Value Ref Range Status   Specimen Description URINE, CATHETERIZED  Final   Special Requests Immunocompromised  Final   Culture NO GROWTH 1 DAY  Final   Report Status 12/13/2014 FINAL  Final    Coagulation Studies: No results for input(s): LABPROT, INR in the last 72 hours.  Imaging: Mr Sherrin Daisy Contrast  12/11/2014  CLINICAL DATA:  Stroke.  Altered mental status, dementia. EXAM: MRI HEAD WITHOUT CONTRAST TECHNIQUE: Multiplanar, multiecho pulse sequences of the brain and surrounding structures were obtained without intravenous contrast. COMPARISON:  CT head 10/27/2011 FINDINGS: Progressive enlargement of the ventricles which are now moderately dilated. Previously there were bilateral subdural hematomas with effacement of the subarachnoid space. Subarachnoid spaces are more prominent on today's study. It is possible this is related to progressive atrophy however I think there is more likely an element of communicating hydrocephalus present. This may represent normal pressure hydrocephalus. Negative for acute infarct. Mild chronic microvascular ischemic change in the white matter. Chronic lesion in the right brachium pontis most likely is an area of chronic ischemia.  Brainstem and cerebellum intact. Negative for mass lesion. Small areas of chronic micro hemorrhage in the right occipital parietal lobe and left parietal lobe. Interval resolution of bilateral subdural hematomas. IMPRESSION: Negative for acute infarct. Mild to moderate chronic microvascular ischemic change in the white matter. Ventricles are moderately large. There  is an element of atrophy however the ventricles are larger than expected for atrophy raising the possibility of normal pressure hydrocephalus. Electronically Signed   By: Marlan Palau M.D.   On: 12/11/2014 20:53   Dg Chest Port 1 View  12/11/2014  CLINICAL DATA:  Aspiration. EXAM: PORTABLE CHEST 1 VIEW COMPARISON:  08/31/2011 FINDINGS: Cardiomediastinal silhouette is stably enlarged. Mediastinal contours appear intact. There is right peritracheal thickening with uncertain etiology. Rounded density overlying the lower mid thorax may represent a hiatal hernia. There is no evidence of focal airspace consolidation, pleural effusion or pneumothorax. Chronic interstitial lung disease is noted. Osseous structures are without acute abnormality. Soft tissues are grossly normal. IMPRESSION: No evidence of focal airspace consolidation. Chronic interstitial lung disease. Stable cardiomegaly. Right peritracheal soft tissue thickening with uncertain etiology, possibly artifactual. Repeated PA and lateral radiograph of the chest may be considered. Electronically Signed   By: Ted Mcalpine M.D.   On: 12/11/2014 22:37    Medications:  Scheduled: .  stroke: mapping our early stages of recovery book   Does not apply Once  . cefTRIAXone (ROCEPHIN)  IV  1 g Intravenous Q24H  . heparin  5,000 Units Subcutaneous 3 times per day  . [START ON 12/14/2014] levothyroxine  75 mcg Oral QAC breakfast  . metroNIDAZOLE  1 application Topical BID   Continuous:  DPO:[id number], hydrALAZINE, RESOURCE THICKENUP CLEAR  Assessment/Plan:   81 year old female patient with advanced dementia, wheelchair-bound, nonverbal and nonambulatory at baseline. She also has history of bilateral subdural hematomas in 2013 with a left craniotomy. Admitted after having a seizure in a nursing facility. MRI did not show any acute intracranial pathology or an acute infarct.   The EEG done yesterday was abnormal with intermittent abnormal  epileptiform discharges in the left frontal region, with generalized background slowing and GPED's suggestive of at least moderate encephalopathy. No evidence of electrographic seizures were seen. As discussed in my yesterday's note, I had an extensive discussion with the patient's 2 daughters at bedside about her current assessment, EEG results and her risk for seizures, and they requested no seizure medicines for her.  Patient has failed swallow evaluation. The 2 daughters are going to discuss with other family members about long-term care goals, especially regarding PEG tube placement versus palliative care if patient continues to have increased aspiration risk over the next few days.  No other new recommendations from neurology standpoint. We will follow up as needed. Please call for any further questions.

## 2014-12-14 DIAGNOSIS — N390 Urinary tract infection, site not specified: Secondary | ICD-10-CM

## 2014-12-14 DIAGNOSIS — B9620 Unspecified Escherichia coli [E. coli] as the cause of diseases classified elsewhere: Secondary | ICD-10-CM

## 2014-12-14 LAB — CBC
HEMATOCRIT: 35.8 % — AB (ref 36.0–46.0)
HEMOGLOBIN: 12 g/dL (ref 12.0–15.0)
MCH: 29.9 pg (ref 26.0–34.0)
MCHC: 33.5 g/dL (ref 30.0–36.0)
MCV: 89.3 fL (ref 78.0–100.0)
Platelets: 221 10*3/uL (ref 150–400)
RBC: 4.01 MIL/uL (ref 3.87–5.11)
RDW: 13.4 % (ref 11.5–15.5)
WBC: 7.6 10*3/uL (ref 4.0–10.5)

## 2014-12-14 LAB — BASIC METABOLIC PANEL
Anion gap: 9 (ref 5–15)
BUN: 11 mg/dL (ref 6–20)
CALCIUM: 8.3 mg/dL — AB (ref 8.9–10.3)
CHLORIDE: 102 mmol/L (ref 101–111)
CO2: 27 mmol/L (ref 22–32)
CREATININE: 0.71 mg/dL (ref 0.44–1.00)
GFR calc Af Amer: >60 mL/min (ref >60)
GFR calc non Af Amer: >60 mL/min (ref >60)
Glucose, Bld: 107 mg/dL — ABNORMAL HIGH (ref 65–99)
Potassium: 3 mmol/L — ABNORMAL LOW (ref 3.5–5.1)
SODIUM: 138 mmol/L (ref 135–145)

## 2014-12-14 LAB — HEMOGLOBIN A1C
HEMOGLOBIN A1C: 5.9 % — AB (ref 4.8–5.6)
MEAN PLASMA GLUCOSE: 123 mg/dL

## 2014-12-14 MED ORDER — RESOURCE THICKENUP CLEAR PO POWD: ORAL | Status: DC

## 2014-12-14 MED ORDER — POTASSIUM CHLORIDE 20 MEQ PO PACK
40.0000 meq | PACK | Freq: Once | ORAL | Status: AC
  Administered 2014-12-14: 40 meq via ORAL
  Filled 2014-12-14: qty 2

## 2014-12-14 MED ORDER — POTASSIUM CHLORIDE 20 MEQ PO PACK
40.0000 meq | PACK | Freq: Once | ORAL | Status: DC
  Filled 2014-12-14: qty 2

## 2014-12-14 MED ORDER — MAGNESIUM SULFATE IN D5W 10-5 MG/ML-% IV SOLN
1.0000 g | Freq: Once | INTRAVENOUS | Status: AC
  Administered 2014-12-14: 1 g via INTRAVENOUS
  Filled 2014-12-14: qty 100

## 2014-12-14 MED ORDER — POTASSIUM CHLORIDE CRYS ER 20 MEQ PO TBCR: 40.0000 meq | EXTENDED_RELEASE_TABLET | Freq: Once | ORAL | Status: DC

## 2014-12-14 MED ORDER — POTASSIUM CHLORIDE 10 MEQ/100ML IV SOLN
10.0000 meq | INTRAVENOUS | Status: DC
  Administered 2014-12-14: 10 meq via INTRAVENOUS
  Filled 2014-12-14: qty 100

## 2014-12-14 NOTE — Progress Notes (Signed)
OT Cancellation Note/Discharge  Patient Details Name: Danetra Glock MRN: [id number] DOB: [birth year]   Cancelled Treatment:    Reason Eval/Treat Not Completed: Other (comment). Pt has Medicare and plans to d/c back to SNF. Defer OT needs to SNF. No acute OT needs. OT signing off.   Nils Pyle, OTR/L 12/14/2014, 9:30 AM

## 2014-12-14 NOTE — Care Management Important Message (Signed)
Important Message  Patient Details  Name: [name] MRN: [id number] Date of Birth: Sep 20, 1926   Medicare Important Message Given:  Yes    Oralia Rud Pamelyn Bancroft 12/14/2014, 3:34 PM

## 2014-12-14 NOTE — Discharge Planning (Addendum)
4:16pm ALF admissions liaison at bedside. Requesting equipment and to have patient sit up at bedside in wheelchair. Admissions liaison to speak with RN regarding mobility to wheelchair. CSW contacted [facility] regarding admissions liaison request for equipment. Discharge pending above information.  Patient to be evaluated by admissions liaison for [facility] ALF prior to patient being approved to return to ALF. [facility] admissions liaison to visit patient at bedside on 12/14/2014. MD updated. CSW awaiting call from admissions liaison.  Marcelline Deist, LCSW [phone] Orthopedics: [phone] Surgical: [phone]

## 2014-12-14 NOTE — Discharge Summary (Signed)
Physician Discharge Summary  [name] JEH:[id number] DOB: 01-05-1927 DOA: 12/11/2014  PCP: Kem Boroughs, MD  Admit date: 12/11/2014 Discharge date: 12/14/2014  Time spent: 35 minutes  Recommendations for Outpatient Follow-up:  1. DNR 2. DYS diet   Discharge Diagnoses:  Principal Problem:   Seizures (HCC) Active Problems:   Rheumatoid arthritis (HCC)   Hypertension   Seizure (HCC)   E-coli UTI   Dementia   Wheelchair bound   Discharge Condition: stable  Diet recommendation: DYS 1 honey thick  There were no vitals filed for this visit.  History of present illness:  [name] is a 81 y.o. female, with rheumatoid arthritis, advanced dementia, hypertension who is wheelchair-bound and lives at [facility] in [place]. 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 She presents to [facility] as a direct admit from [facility] after having a seizure and receiving CPR at her assisted living facility this morning. Per her daughter's report the patient was found at 6:30 AM having tonic clonic movements. Her blood pressure was very high at that time (greater than 200). Shortly after that she was found to be pulseless and apneic and received CPR, but no ACLS drugs or intubation. She was then transferred to [facility]. CT head revealed possible stroke. She was sent to [facility] is a direct admit for neurology consultation. At baseline the patient sits in a wheelchair, is awake, will speak gibberish, is able to feed herself using her fingers. She normally recognizes her daughters, but is not able to carry on conversation. Since the episode this morning she has been less responsive, lethargic, and unable to sit up. Her 3 daughters are jointly her POA. They would appreciate neurologic evaluation and workup, but they want to be reasonable and not overly aggressive with her mother's care. She is a DO NOT RESUSCITATE.  Hospital  Course:  Seizures with concern for possible stroke MRI negative +EEG per neuro: EEG was abnormal with intermittent abnormal epileptiform discharges in the left frontal region, with generalized background slowing and GPED's suggestive of at least moderate encephalopathy. The family does not want aggressive care. They want to appropriately treat her mother but are focused more on her comfort.  Family does not want keppra -DYS diet-- high risk for aspiration  Hypertension Resume home meds once able to take PO  Escherichia coli UTI Repeat UA and culture have been ordered- NGTD Change cipro to rocephin- treated  Dementia Advanced. Stable.  Rheumatoid arthritis I do not see any home medications for this. Presumed stable.  Hypothyroidism Continue Synthroid IV.  Hypokalemia -replete   Procedures:    Consultations:  neuro  Called daughter-- no answer- left message   Discharge Exam: Filed Vitals:   12/14/14 0603 12/14/14 0950  BP: 121/54 151/96  Pulse: 78 89  Temp: 97.5 F (36.4 C) 98.6 F (37 C)  Resp: 15 18    General: repeated "good morning" Cardiovascular: rrr Respiratory: clear  Discharge Instructions   Discharge Instructions    Discharge instructions    Complete by:  As directed   DNR Dysphagia 1 honey thick liquids     Increase activity slowly    Complete by:  As directed           Current Discharge Medication List    START taking these medications   Details  Maltodextrin-Xanthan Gum (RESOURCE THICKENUP CLEAR) POWD As needed for honey thick liquids      CONTINUE these medications which have NOT CHANGED  Details  acetaminophen (TYLENOL) 325 MG tablet Take 650 mg by mouth every 6 (six) hours as needed.    amLODipine (NORVASC) 10 MG tablet Take 10 mg by mouth daily.    calcium carbonate (TUMS) 500 MG chewable tablet Chew 1 tablet by mouth 3 (three) times daily with meals.    clindamycin (CLINDAGEL) 1 % gel Apply 1 application topically  daily as needed (to face for rosacea).    dextromethorphan-guaiFENesin (MUCINEX DM) 30-600 MG 12hr tablet Take 1 tablet by mouth 2 (two) times daily.    hydrALAZINE (APRESOLINE) 25 MG tablet Take 25 mg by mouth 3 (three) times daily.    levothyroxine (SYNTHROID, LEVOTHROID) 75 MCG tablet Take 75 mcg by mouth daily.    lisinopril (PRINIVIL,ZESTRIL) 40 MG tablet Take 40 mg by mouth daily.    loperamide (IMODIUM A-D) 2 MG tablet Take 2 mg by mouth 4 (four) times daily as needed for diarrhea or loose stools.    MAGNESIUM PO Take 241.3 mg by mouth daily.    metroNIDAZOLE (METROGEL) 0.75 % gel Apply 1 application topically 2 (two) times daily. To face    omeprazole (PRILOSEC) 20 MG capsule Take 20 mg by mouth daily.    potassium chloride (K-DUR) 10 MEQ tablet Take 10 mEq by mouth daily.    senna-docusate (SENOKOT-S) 8.6-50 MG tablet Take 1 tablet by mouth at bedtime.      STOP taking these medications     ciprofloxacin (CIPRO) 500 MG tablet      traMADol (ULTRAM) 50 MG tablet        Allergies  Allergen Reactions  . Contrast Media [Iodinated Diagnostic Agents]     unknown  . Sulfa Antibiotics     unknown  . Sulfur    Follow-up Information    Follow up with ARNOLD,TERRY, MD In 1 week.   Specialty:  Internal Medicine   Why:  [facility]   Contact information:   [address] [place] [address] [phone]        The results of significant diagnostics from this hospitalization (including imaging, microbiology, ancillary and laboratory) are listed below for reference.    Significant Diagnostic Studies: Mr Brain Wo Contrast  12/11/2014  CLINICAL DATA:  Stroke.  Altered mental status, dementia. EXAM: MRI HEAD WITHOUT CONTRAST TECHNIQUE: Multiplanar, multiecho pulse sequences of the brain and surrounding structures were obtained without intravenous contrast. COMPARISON:  CT head 10/27/2011 FINDINGS: Progressive enlargement of the ventricles which are now moderately dilated.  Previously there were bilateral subdural hematomas with effacement of the subarachnoid space. Subarachnoid spaces are more prominent on today's study. It is possible this is related to progressive atrophy however I think there is more likely an element of communicating hydrocephalus present. This may represent normal pressure hydrocephalus. Negative for acute infarct. Mild chronic microvascular ischemic change in the white matter. Chronic lesion in the right brachium pontis most likely is an area of chronic ischemia. Brainstem and cerebellum intact. Negative for mass lesion. Small areas of chronic micro hemorrhage in the right occipital parietal lobe and left parietal lobe. Interval resolution of bilateral subdural hematomas. IMPRESSION: Negative for acute infarct. Mild to moderate chronic microvascular ischemic change in the white matter. Ventricles are moderately large. There is an element of atrophy however the ventricles are larger than expected for atrophy raising the possibility of normal pressure hydrocephalus. Electronically Signed   By: Marlan Palau M.D.   On: 12/11/2014 20:53   Dg Chest Port 1 View  12/11/2014  CLINICAL DATA:  Aspiration. EXAM: PORTABLE CHEST 1 VIEW COMPARISON:  08/31/2011 FINDINGS: Cardiomediastinal silhouette is stably enlarged. Mediastinal contours appear intact. There is right peritracheal thickening with uncertain etiology. Rounded density overlying the lower mid thorax may represent a hiatal hernia. There is no evidence of focal airspace consolidation, pleural effusion or pneumothorax. Chronic interstitial lung disease is noted. Osseous structures are without acute abnormality. Soft tissues are grossly normal. IMPRESSION: No evidence of focal airspace consolidation. Chronic interstitial lung disease. Stable cardiomegaly. Right peritracheal soft tissue thickening with uncertain etiology, possibly artifactual. Repeated PA and lateral radiograph of the chest may be considered.  Electronically Signed   By: Ted Mcalpine M.D.   On: 12/11/2014 22:37    Microbiology: Recent Results (from the past 240 hour(s))  Culture, Urine     Status: None   Collection Time: 12/12/14  6:57 AM  Result Value Ref Range Status   Specimen Description URINE, CATHETERIZED  Final   Special Requests Immunocompromised  Final   Culture NO GROWTH 1 DAY  Final   Report Status 12/13/2014 FINAL  Final     Labs: Basic Metabolic Panel:  Recent Labs Lab 12/11/14 1913 12/12/14 0442 12/14/14 0030  NA 139 137 138  K 3.5 3.3* 3.0*  CL 104 103 102  CO2 28 27 27   GLUCOSE 170* 146* 107*  BUN 6 6 11   CREATININE 0.55 0.60 0.71  CALCIUM 8.6* 8.3* 8.3*   Liver Function Tests: No results for input(s): AST, ALT, ALKPHOS, BILITOT, PROT, ALBUMIN in the last 168 hours. No results for input(s): LIPASE, AMYLASE in the last 168 hours. No results for input(s): AMMONIA in the last 168 hours. CBC:  Recent Labs Lab 12/11/14 1913 12/12/14 0442 12/14/14 0030  WBC 8.9 7.5 7.6  HGB 12.2 11.5* 12.0  HCT 37.8 35.6* 35.8*  MCV 91.1 90.8 89.3  PLT 218 208 221   Cardiac Enzymes: No results for input(s): CKTOTAL, CKMB, CKMBINDEX, TROPONINI in the last 168 hours. BNP: BNP (last 3 results) No results for input(s): BNP in the last 8760 hours.  ProBNP (last 3 results) No results for input(s): PROBNP in the last 8760 hours.  CBG:  Recent Labs Lab 12/11/14 1905  GLUCAP 148*       Signed:  Nikan Ellingson  [facility] 12/14/2014, 12:04 PM

## 2014-12-14 NOTE — [place] FL2 (Addendum)
[place] MEDICAID FL2 LEVEL OF CARE SCREENING TOOL     IDENTIFICATION  Patient Name: [name] Birthdate: 12-17-1926 Sex: female Admission Date (Current Location): 12/11/2014  [facility] and [place] Number:     Facility and Address:  [facility], [address], [place], [place] [address]      Provider Number: [id number]  Attending Physician Name and Address:  Joseph Art, DO  Relative Name and Phone Number:  Cherylann Ratel, [phone]    Current Level of Care: Hospital Recommended Level of Care: Skilled Nursing Facility  Prior Approval Number:    Date Approved/Denied:   PASRR Number:    Discharge Plan: Skilled Nursing Facility   Current Diagnoses: Patient Active Problem List   Diagnosis Date Noted  . Seizure (HCC) 12/11/2014  . E-coli UTI 12/11/2014  . Dementia 12/11/2014  . Wheelchair bound 12/11/2014  . Seizures (HCC)   . Rheumatoid arthritis (HCC)   . Hypertension   . SDH (subdural hematoma) (HCC) 09/07/2011  . PALPITATIONS 10/20/2008  . PREMATURE VENTRICULAR CONTRACTIONS 10/07/2008    Orientation ACTIVITIES/SOCIAL BLADDER RESPIRATION     (Disoriented x4)  Family supportive Incontinent Normal  BEHAVIORAL SYMPTOMS/MOOD NEUROLOGICAL BOWEL NUTRITION STATUS  Other (Comment) (n/a)  (n/a) Incontinent Diet (Please see discharge summary.)  PHYSICIAN VISITS COMMUNICATION OF NEEDS Height & Weight Skin    Does not communicate   98 lbs. Normal          AMBULATORY STATUS RESPIRATION     (Dependent.) Normal      Personal Care Assistance Level of Assistance  Bathing, Feeding, Dressing Bathing Assistance: Maximum assistance Feeding assistance: Maximum assistance Dressing Assistance: Maximum assistance      Functional Limitations Info   (Please see discharge summary.)             SPECIAL CARE FACTORS FREQUENCY                      Additional Factors Info  Code Status Code Status Info: DNR              Current Medications (12/14/2014): Current Facility-Administered Medications  Medication Dose Route Frequency Provider Last Rate Last Dose  .  stroke: mapping our early stages of recovery book   Does not apply Once Stephani Police, PA-C      . acetaminophen (TYLENOL) tablet 650 mg  650 mg Oral Q6H PRN Stephani Police, PA-C      . cefTRIAXone (ROCEPHIN) 1 g in dextrose 5 % 50 mL IVPB  1 g Intravenous Q24H Joseph Art, DO   1 g at 12/13/14 1604  . heparin injection 5,000 Units  5,000 Units Subcutaneous 3 times per day Stephani Police, PA-C   5,000 Units at 12/14/14 1696  . hydrALAZINE (APRESOLINE) injection 10 mg  10 mg Intravenous Q6H PRN Stephani Police, PA-C      . levothyroxine (SYNTHROID, LEVOTHROID) tablet 75 mcg  75 mcg Oral QAC breakfast Joseph Art, DO   75 mcg at 12/14/14 0802  . metroNIDAZOLE (METROGEL) 0.75 % gel 1 application  1 application Topical BID Stephani Police, PA-C   1 application at 12/13/14 2211  . potassium chloride (KLOR-CON) packet 40 mEq  40 mEq Oral Once Joseph Art, DO      . RESOURCE THICKENUP CLEAR   Oral PRN Joseph Art, DO       Do not use this list as official medication orders. Please verify with discharge  summary.  Discharge Medications:   Medication List    STOP taking these medications        ciprofloxacin 500 MG tablet  Commonly known as:  CIPRO     traMADol 50 MG tablet  Commonly known as:  ULTRAM      TAKE these medications        acetaminophen 325 MG tablet  Commonly known as:  TYLENOL  Take 650 mg by mouth every 6 (six) hours as needed.     amLODipine 10 MG tablet  Commonly known as:  NORVASC  Take 10 mg by mouth daily.     clindamycin 1 % gel  Commonly known as:  CLINDAGEL  Apply 1 application topically daily as needed (to face for rosacea).     dextromethorphan-guaiFENesin 30-600 MG 12hr tablet  Commonly known as:  MUCINEX DM  Take 1 tablet by mouth 2 (two) times daily.     hydrALAZINE 25 MG tablet  Commonly  known as:  APRESOLINE  Take 25 mg by mouth 3 (three) times daily.     levothyroxine 75 MCG tablet  Commonly known as:  SYNTHROID, LEVOTHROID  Take 75 mcg by mouth daily.     lisinopril 40 MG tablet  Commonly known as:  PRINIVIL,ZESTRIL  Take 40 mg by mouth daily.     loperamide 2 MG tablet  Commonly known as:  IMODIUM A-D  Take 2 mg by mouth 4 (four) times daily as needed for diarrhea or loose stools.     MAGNESIUM PO  Take 241.3 mg by mouth daily.     metroNIDAZOLE 0.75 % gel  Commonly known as:  METROGEL  Apply 1 application topically 2 (two) times daily. To face     omeprazole 20 MG capsule  Commonly known as:  PRILOSEC  Take 20 mg by mouth daily.     potassium chloride 10 MEQ tablet  Commonly known as:  K-DUR  Take 10 mEq by mouth daily.     RESOURCE THICKENUP CLEAR Powd  As needed for honey thick liquids     senna-docusate 8.6-50 MG tablet  Commonly known as:  Senokot-S  Take 1 tablet by mouth at bedtime.     TUMS 500 MG chewable tablet  Generic drug:  calcium carbonate  Chew 1 tablet by mouth 3 (three) times daily with meals.        Relevant Imaging Results:  Relevant Lab Results:  Recent Labs    Additional Information Social Security #: [id number]  Rojelio Brenner [phone]  Derenda Fennel, MSW, LCSWA [phone] 12/15/2014 10:03 AM

## 2014-12-14 NOTE — Progress Notes (Signed)
CM met with patient's daughters and Runell Gess, RN from patient's ALF. Patient was not evaluated by PT over the weekend bedcause it was understood that patient was from a SNF and would be returning.  Patient's ALF is unable to take patient back without PT notes verifying that she is able to tolerate sitting upright in a wheelchair. If they are able to take the patient back, they are requesting an order for a hospital bed and potentially a new wheelchair (based on PT recommendations for patient's safety and comfort). They are also requesting that HHPT/SLP/RN be ordered at discharge. Attending MD and CSW are aware that patient will not be able to discharge today.  Once patient's functional level is determined by PT, requested clinicals, DME, and HH orders will be faxed to [phone] attn: Carla. [name] can be reached at [phone] or [phone] to provide updates.  Bedside RN updated.   Lorne Skeens RN, MSN [phone]

## 2014-12-14 NOTE — Clinical Social Work Note (Signed)
Clinical Social Work Assessment  Patient Details  Name: [name] MRN: [id number] Date of Birth: Jun 29, 1926  Date of referral:  12/14/14               Reason for consult:  Facility Placement, Discharge Planning                Permission sought to share information with:  Facility Medical sales representative, Family Supports Permission granted to share information::  Yes, Verbal Permission Granted  Name::     Tammy Lopp  Agency::  [facility] [phone])  Relationship::  Daughter  Contact Information:  [phone]  Housing/Transportation Living arrangements for the past 2 months:  Assisted Living Facility [facility]) Source of Information:  Adult Children Patient Interpreter Needed:  None Criminal Activity/Legal Involvement Pertinent to Current Situation/Hospitalization:  No - Comment as needed Significant Relationships:  Adult Children Lives with:  Facility Resident Do you feel safe going back to the place where you live?  Yes Need for family participation in patient care:  Yes (Comment) (Patient's daughter active in patient's care.)  Care giving concerns:  Patient's daughter expressed no concerns at this time.   Social Worker assessment / plan:  CSW received referral patient from [facility] and to return once medically stable. CSW spoke with patient's daughter regarding discharge plan. Per patient's daughter, patient's family would prefer for patient to return to ALF once medically stable for discharge. CSW to continue to follow and assist with discharge planning needs.  Employment status:  Retired Health and safety inspector:  Medicare PT Recommendations:  Not assessed at this time Information / Referral to community resources:  Other (Comment Required) (n/a)  Patient/Family's Response to care:  Patient's daughter understanding and agreeable to CSW plan of care.  Patient/Family's Understanding of and Emotional Response to Diagnosis, Current  Treatment, and Prognosis:  Patient's daughter understanding and agreeable to CSW plan of care.  Emotional Assessment Appearance:  Other (Comment Required (CSW spoke with patient's daughter, Babette Relic.) Attitude/Demeanor/Rapport:  Other (CSW spoke with patient's daughter, Babette Relic.) Affect (typically observed):  Other (CSW spoke with patient's daughter, Babette Relic.) Orientation:  Oriented to Self Alcohol / Substance use:  Not Applicable Psych involvement (Current and /or in the community):  No (Comment) (Not appropriate on this admission.)  Discharge Needs  Concerns to be addressed:  No discharge needs identified Readmission within the last 30 days:  No Current discharge risk:  None Barriers to Discharge:  No Barriers Identified   Rod Mae, LCSW 12/14/2014, 2:57 PM [phone]

## 2014-12-15 LAB — BASIC METABOLIC PANEL
Anion gap: 9 (ref 5–15)
BUN: 13 mg/dL (ref 6–20)
CALCIUM: 8.4 mg/dL — AB (ref 8.9–10.3)
CHLORIDE: 105 mmol/L (ref 101–111)
CO2: 27 mmol/L (ref 22–32)
CREATININE: 0.69 mg/dL (ref 0.44–1.00)
Glucose, Bld: 87 mg/dL (ref 65–99)
Potassium: 3.2 mmol/L — ABNORMAL LOW (ref 3.5–5.1)
SODIUM: 141 mmol/L (ref 135–145)

## 2014-12-15 MED ORDER — POTASSIUM CHLORIDE 20 MEQ/15ML (10%) PO SOLN
40.0000 meq | Freq: Once | ORAL | Status: AC
  Administered 2014-12-15: 40 meq via ORAL
  Filled 2014-12-15: qty 30

## 2014-12-15 MED ORDER — POTASSIUM CHLORIDE 20 MEQ/15ML (10%) PO SOLN
ORAL | Status: AC
  Filled 2014-12-15: qty 15

## 2014-12-15 NOTE — Progress Notes (Signed)
CM called patient's daughter, Babette Relic, to provide update. Patient has a bed at [facility] today, awaiting PASARR.  Daughter to call Marcelino Duster at [facility] to make arrangements to sign paperwork. Will notify daughter when transport has been set up.  Elmer Bales RN, MSN [phone]

## 2014-12-15 NOTE — Clinical Social Work Note (Signed)
Clinical Social Worker facilitated patient discharge including contacting patient family and facility to confirm patient discharge plans.  Clinical information faxed to facility and family agreeable with plan.  CSW arranged ambulance transport via [facility] to [facility].  RN to call report prior to discharge.  Clinical Social Worker will sign off for now as social work intervention is no longer needed. Please consult [place] again if new need arises.  Derenda Fennel, MSW, LCSWA [phone] 12/15/2014 4:14 PM

## 2014-12-15 NOTE — Care Management Note (Signed)
Case Management Note  Patient Details  Name: [name] MRN: [id number] Date of Birth: [birth year]  Subjective/Objective:                    Action/Plan: Received word from CSW that patient's PASARR number has been assigned. Voicemail was left for patient's daughter, Babette Relic, to notify that transportation is being arranged to [facility] today.  Expected Discharge Date:                  Expected Discharge Plan:  Skilled Nursing Facility  In-House Referral:  Clinical Social Work  Discharge planning Services     Post Acute Care Choice:    Choice offered to:  Adult Children  DME Arranged:    DME Agency:     HH Arranged:    HH Agency:     Status of Service:  Completed, signed off  Medicare Important Message Given:  Yes Date Medicare IM Given:    Medicare IM give by:    Date Additional Medicare IM Given:    Additional Medicare Important Message give by:     If discussed at Long Length of Stay Meetings, dates discussed:    Additional Comments:  Anda Kraft, RN 12/15/2014, 4:17 PM

## 2014-12-15 NOTE — Clinical Social Work Placement (Signed)
   CLINICAL SOCIAL WORK PLACEMENT  NOTE  Date:  12/15/2014  Patient Details  Name: [name] MRN: [id number] Date of Birth: 05/06/1926  Clinical Social Work is seeking post-discharge placement for this patient at the Skilled  Nursing Facility level of care (*CSW will initial, date and re-position this form in  chart as items are completed):  Yes   Patient/family provided with [facility] Clinical Social Work Department's list of facilities offering this level of care within the geographic area requested by the patient (or if unable, by the patient's family).  Yes   Patient/family informed of their freedom to choose among providers that offer the needed level of care, that participate in Medicare, Medicaid or managed care program needed by the patient, have an available bed and are willing to accept the patient.  Yes   Patient/family informed of [facility] ownership interest in [facility] and [facility], as well as of the fact that they are under no obligation to receive care at these facilities.  PASRR submitted to EDS on 12/15/14     PASRR number received on 12/15/14     Existing PASRR number confirmed on       FL2 transmitted to all facilities in geographic area requested by pt/family on 12/15/14     FL2 transmitted to all facilities within larger geographic area on 12/15/14     Patient informed that his/her managed care company has contracts with or will negotiate with certain facilities, including the following:        Yes   Patient/family informed of bed offers received.  Patient chooses bed at  [facility] )     Physician recommends and patient chooses bed at      Patient to be transferred to  [facility] ) on 12/15/14.  Patient to be transferred to facility by  [facility] )     Patient family notified on 12/15/14 of transfer.  Name of family member notified:   (RNCM notified pt's dtr, Tammy via telephone. )     PHYSICIAN        Additional Comment:    _______________________________________________ Derenda Fennel, MSW, LCSWA [phone] 12/15/2014 4:15 PM

## 2014-12-15 NOTE — Clinical Social Work Note (Signed)
PT currently recommending SNF placement. [facility] unable to care for patient at this time.   SNF process explained and family prefers [facility].  Patient has a bed at SNF, [facility]. Facility aware that patient will transition today.   Currently awaiting [facility] PAS[facility] (under manual review). CSW remains available as needed.   Derenda Fennel, MSW, LCSWA [phone] 12/15/2014 12:51 PM

## 2014-12-15 NOTE — Clinical Social Work Note (Signed)
Clinical Social Worker has contacted [facility] twice today to expedite manual review for PASARR. [facility] has indicated that they are short staff and will likely review PASARR between 4:30pm-12am.   Family, Facility, RNCM and MD notified.   CSW will continue to follow pt and pt's family for continued support and to facilitate pt's discharge needs once PASARR is obtained.   Derenda Fennel, MSW, LCSWA [phone] 12/15/2014 3:36 PM

## 2014-12-15 NOTE — Progress Notes (Signed)
Speech Language Pathology Treatment: Dysphagia  Patient Details Name: [name] MRN: [id number] DOB: 06-04-1926 Today's Date: 12/15/2014 Time: 4008-6761 SLP Time Calculation (min) (ACUTE ONLY): 12 min  Assessment / Plan / Recommendation Clinical Impression  Pt is more alert, participatory since last SLP session.  She is able to self feed with assist.  Demonstrated active oral recognition/anticipation of POs with appropriate mastication and oral control of solids and liquids.  Despite consuming large quantities of water, there were no overt s/s of aspiration.  Pt required min verbal and tactile cues for pacing and quantity of POs.  Recommend advancing diet to mechanical soft; include finger foods in diet.  Pt no longer needs thickened liquids.  SLP to sign off.    HPI HPI: Pt is a 81 y.o. female, with rheumatoid arthritis, advanced dementia, hypertension who is wheelchair-bound and lives at [facility] in [place]. Pt presents to [facility] after having a seizure and receiving CPR at her ALF. MRI of brain negative for acute infarct.       SLP Plan  All goals met     Recommendations  Diet recommendations: Dysphagia 3 (mechanical soft);Thin liquid Liquids provided via: Cup;No straw Medication Administration: Crushed with puree Supervision: Staff to assist with self feeding Postural Changes and/or Swallow Maneuvers: Seated upright 90 degrees              Oral Care Recommendations: Oral care BID Follow up Recommendations: None Plan: All goals met   Juan Quam Laurice 12/15/2014, 11:07 AM

## 2014-12-15 NOTE — Care Management Note (Signed)
Case Management Note  Patient Details  Name: [name] MRN: [id number] Date of Birth: 1926/06/07  Subjective/Objective:                    Action/Plan: After speaking with Larita Fife with PT, it appears that patient will not be able to meet the criteria for being able to return to [facility] in [place]. CM spoke with Percell Belt RN at [facility] to provide update and discuss PT recommendation.  Patient was not able to tolerate >15 minutes sitting upright in the chair, so [facility] will not be able to accept her back.  CM called patient's daughter, Babette Relic, to make her aware. Permission was received to fax patient out to [facility], with the preference being a facility in [place].  After speaking with her sisters, Babette Relic provided a first choice of [facility].  CM discussed the process of obtaining bed offers and stated that a bed would have to be chosen from the available offers for discharge today.  Daughter is aware that CSW will work hard to accommodate their first choice, but no promises can be made.  CSW is aware and has initiated the SNF search.  Message was sent to Dr Benjamine Mola to provide update.  Expected Discharge Date:                  Expected Discharge Plan:  Skilled Nursing Facility  In-House Referral:  Clinical Social Work  Discharge planning Services     Post Acute Care Choice:    Choice offered to:  Adult Children  DME Arranged:    DME Agency:     HH Arranged:    HH Agency:     Status of Service:  In process, will continue to follow  Medicare Important Message Given:  Yes Date Medicare IM Given:    Medicare IM give by:    Date Additional Medicare IM Given:    Additional Medicare Important Message give by:     If discussed at Long Length of Stay Meetings, dates discussed:    Additional Comments:  Anda Kraft, RN 12/15/2014, 10:48 AM

## 2014-12-15 NOTE — Progress Notes (Signed)
Pt discharged from hospital per MD order to skilled nursing facility. Pt exited hospital via stretcher. IV removed, clean, dry and intact.

## 2014-12-15 NOTE — Discharge Summary (Signed)
Physician Discharge Summary  [name] QIW:[id number] DOB: 06/13/1926 DOA: 12/11/2014  PCP: Kem Boroughs, MD  Admit date: 12/11/2014 Discharge date: 12/15/2014  Time spent: 35 minutes  Recommendations for Outpatient Follow-up:  DNR Dysphagia 3 (mechanical soft);Thin liquid   Discharge Diagnoses:  Principal Problem:   Seizures (HCC) Active Problems:   Rheumatoid arthritis (HCC)   Hypertension   Seizure (HCC)   E-coli UTI   Dementia   Wheelchair bound   Discharge Condition: stable  Diet recommendation: DYS 1 honey thick  There were no vitals filed for this visit.  History of present illness:  [name] is a 81 y.o. female, with rheumatoid arthritis, advanced dementia, hypertension who is wheelchair-bound and lives at [facility] in [place]. 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 She presents to [facility] as a direct admit from [facility] after having a seizure and receiving CPR at her assisted living facility this morning. Per her daughter's report the patient was found at 6:30 AM having tonic clonic movements. Her blood pressure was very high at that time (greater than 200). Shortly after that she was found to be pulseless and apneic and received CPR, but no ACLS drugs or intubation. She was then transferred to [facility]. CT head revealed possible stroke. She was sent to [facility] is a direct admit for neurology consultation. At baseline the patient sits in a wheelchair, is awake, will speak gibberish, is able to feed herself using her fingers. She normally recognizes her daughters, but is not able to carry on conversation. Since the episode this morning she has been less responsive, lethargic, and unable to sit up. Her 3 daughters are jointly her POA. They would appreciate neurologic evaluation and workup, but they want to be reasonable and not overly aggressive with her mother's care. She is a DO NOT  RESUSCITATE.  Hospital Course:  Seizures with concern for possible stroke MRI negative +EEG per neuro: EEG was abnormal with intermittent abnormal epileptiform discharges in the left frontal region, with generalized background slowing and GPED's suggestive of at least moderate encephalopathy. The family does not want aggressive care. They want to appropriately treat her mother but are focused more on her comfort.  Family does not want keppra -DYS diet-- high risk for aspiration  Hypertension Resume  Escherichia coli UTI Repeat UA and culture have been ordered- NGTD Change cipro to rocephin- treated  Dementia Advanced. Stable.  Rheumatoid arthritis I do not see any home medications for this. Presumed stable.  Hypothyroidism Resume home meds  Hypokalemia -replete   Procedures:    Consultations:  neuro  Called daughter-- no answer- left message 11/21   Discharge Exam: Filed Vitals:   12/15/14 0605 12/15/14 0939  BP: 148/57 154/72  Pulse: 75 101  Temp: 97.5 F (36.4 C) 98 F (36.7 C)  Resp: 15 15    General: up in chair with baby doll Cardiovascular: rrr Respiratory: clear  Discharge Instructions   Discharge Instructions    Discharge instructions    Complete by:  As directed   DNR Dysphagia 3 with thin liquids     Increase activity slowly    Complete by:  As directed           Current Discharge Medication List    START taking these medications   Details  Maltodextrin-Xanthan Gum (RESOURCE THICKENUP CLEAR) POWD As needed for honey thick liquids      CONTINUE these medications which have NOT CHANGED  Details  acetaminophen (TYLENOL) 325 MG tablet Take 650 mg by mouth every 6 (six) hours as needed.    amLODipine (NORVASC) 10 MG tablet Take 10 mg by mouth daily.    calcium carbonate (TUMS) 500 MG chewable tablet Chew 1 tablet by mouth 3 (three) times daily with meals.    clindamycin (CLINDAGEL) 1 % gel Apply 1 application topically daily  as needed (to face for rosacea).    dextromethorphan-guaiFENesin (MUCINEX DM) 30-600 MG 12hr tablet Take 1 tablet by mouth 2 (two) times daily.    hydrALAZINE (APRESOLINE) 25 MG tablet Take 25 mg by mouth 3 (three) times daily.    levothyroxine (SYNTHROID, LEVOTHROID) 75 MCG tablet Take 75 mcg by mouth daily.    lisinopril (PRINIVIL,ZESTRIL) 40 MG tablet Take 40 mg by mouth daily.    loperamide (IMODIUM A-D) 2 MG tablet Take 2 mg by mouth 4 (four) times daily as needed for diarrhea or loose stools.    MAGNESIUM PO Take 241.3 mg by mouth daily.    metroNIDAZOLE (METROGEL) 0.75 % gel Apply 1 application topically 2 (two) times daily. To face    omeprazole (PRILOSEC) 20 MG capsule Take 20 mg by mouth daily.    potassium chloride (K-DUR) 10 MEQ tablet Take 10 mEq by mouth daily.    senna-docusate (SENOKOT-S) 8.6-50 MG tablet Take 1 tablet by mouth at bedtime.      STOP taking these medications     ciprofloxacin (CIPRO) 500 MG tablet      traMADol (ULTRAM) 50 MG tablet        Allergies  Allergen Reactions  . Contrast Media [Iodinated Diagnostic Agents]     unknown  . Sulfa Antibiotics     unknown  . Sulfur    Follow-up Information    Follow up with ARNOLD,TERRY, MD In 1 week.   Specialty:  Internal Medicine   Why:  [facility]   Contact information:   [facility] [address] [facility] [address] [place] [address] [phone]        The results of significant diagnostics from this hospitalization (including imaging, microbiology, ancillary and laboratory) are listed below for reference.    Significant Diagnostic Studies: Mr Brain Wo Contrast  12/11/2014  CLINICAL DATA:  Stroke.  Altered mental status, dementia. EXAM: MRI HEAD WITHOUT CONTRAST TECHNIQUE: Multiplanar, multiecho pulse sequences of the brain and surrounding structures were obtained without intravenous contrast. COMPARISON:  CT head 10/27/2011 FINDINGS: Progressive enlargement of the ventricles which are now moderately dilated.  Previously there were bilateral subdural hematomas with effacement of the subarachnoid space. Subarachnoid spaces are more prominent on today's study. It is possible this is related to progressive atrophy however I think there is more likely an element of communicating hydrocephalus present. This may represent normal pressure hydrocephalus. Negative for acute infarct. Mild chronic microvascular ischemic change in the white matter. Chronic lesion in the right brachium pontis most likely is an area of chronic ischemia. Brainstem and cerebellum intact. Negative for mass lesion. Small areas of chronic micro hemorrhage in the right occipital parietal lobe and left parietal lobe. Interval resolution of bilateral subdural hematomas. IMPRESSION: Negative for acute infarct. Mild to moderate chronic microvascular ischemic change in the white matter. Ventricles are moderately large. There is an element of atrophy however the ventricles are larger than expected for atrophy raising the possibility of normal pressure hydrocephalus. Electronically Signed   By: Marlan Palau M.D.   On: 12/11/2014 20:53   Dg Chest Port 1 View  12/11/2014  CLINICAL DATA:  Aspiration. EXAM: PORTABLE CHEST 1 VIEW COMPARISON:  08/31/2011 FINDINGS: Cardiomediastinal silhouette is stably enlarged. Mediastinal contours appear intact. There is right peritracheal thickening with uncertain etiology. Rounded density overlying the lower mid thorax may represent a hiatal hernia. There is no evidence of focal airspace consolidation, pleural effusion or pneumothorax. Chronic interstitial lung disease is noted. Osseous structures are without acute abnormality. Soft tissues are grossly normal. IMPRESSION: No evidence of focal airspace consolidation. Chronic interstitial lung disease. Stable cardiomegaly. Right peritracheal soft tissue thickening with uncertain etiology, possibly artifactual. Repeated PA and lateral radiograph of the chest may be considered.  Electronically Signed   By: Ted Mcalpine M.D.   On: 12/11/2014 22:37    Microbiology: Recent Results (from the past 240 hour(s))  Culture, Urine     Status: None   Collection Time: 12/12/14  6:57 AM  Result Value Ref Range Status   Specimen Description URINE, CATHETERIZED  Final   Special Requests Immunocompromised  Final   Culture NO GROWTH 1 DAY  Final   Report Status 12/13/2014 FINAL  Final     Labs: Basic Metabolic Panel:  Recent Labs Lab 12/11/14 1913 12/12/14 0442 12/14/14 0030 12/15/14 0606  NA 139 137 138 141  K 3.5 3.3* 3.0* 3.2*  CL 104 103 102 105  CO2 28 27 27 27   GLUCOSE 170* 146* 107* 87  BUN 6 6 11 13   CREATININE 0.55 0.60 0.71 0.69  CALCIUM 8.6* 8.3* 8.3* 8.4*   Liver Function Tests: No results for input(s): AST, ALT, ALKPHOS, BILITOT, PROT, ALBUMIN in the Young 168 hours. No results for input(s): LIPASE, AMYLASE in the Young 168 hours. No results for input(s): AMMONIA in the Young 168 hours. CBC:  Recent Labs Lab 12/11/14 1913 12/12/14 0442 12/14/14 0030  WBC 8.9 7.5 7.6  HGB 12.2 11.5* 12.0  HCT 37.8 35.6* 35.8*  MCV 91.1 90.8 89.3  PLT 218 208 221   Cardiac Enzymes: No results for input(s): CKTOTAL, CKMB, CKMBINDEX, TROPONINI in the Young 168 hours. BNP: BNP (Young 3 results) No results for input(s): BNP in the Young 8760 hours.  ProBNP (Young 3 results) No results for input(s): PROBNP in the Young 8760 hours.  CBG:  Recent Labs Lab 12/11/14 1905  GLUCAP 148*       Signed:  Sumaya Riedesel  [facility] 12/15/2014, 11:57 AM

## 2014-12-15 NOTE — Evaluation (Signed)
Physical Therapy Evaluation Patient Details Name: [name] MRN: [id number] DOB: 11/15/1926 Today's Date: 12/15/2014   History of Present Illness  [name] is a 81 y.o. female, with rheumatoid arthritis, advanced dementia, hypertension who is wheelchair-bound and lives at [facility] in [place]. 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 11`/18 she presents to [facility] as a direct admit from [facility] after having a seizure and receiving CPR at her assisted living facility  MRI negative +EEG moderate encephalopathy.    Clinical Impression  Made aware of need for PT evaluation for assist in determining patient's ability to return to her ALF. Currently recommending SNF as patient cannot safely sit up in a wheelchair (or even reclining w/c) for >15 minutes at this time without either leaning too far forward or leaning/falling to her left. Recommend PT at SNF as pt has had a functional decline for her (required incr assist with transfer and decreased balance--may improve if this is post-ictal).   Prior to evaluating [name], I spoke with [name], RN at [facility] re: pt's prior status. [name] explained that they do not have or use lifts at their facility, so pt's need to be able to transfer with 2 person assist. Ms. [name] will need to safely sit up in a wheelchair for 3-4 hours at a time and they cannot use specialty positioning cushions, lap belts, or other attachments to assist pt while in the chair to remain safe (considered restraints in their facility).     Follow Up Recommendations SNF ( (pt has had significant functional decline))    Equipment Recommendations  None recommended by PT    Recommendations for Other Services       Precautions / Restrictions Precautions Precautions: Fall      Mobility  Bed Mobility Overal bed mobility: Needs Assistance Bed Mobility: Rolling;Sidelying to Sit Rolling: Total  assist Sidelying to sit: Max assist       General bed mobility comments: pt assisted taking 2nd leg over EOB; assisted with RUE to push towards sitting, even placing her elbow in support positon  Transfers Overall transfer level: Needs assistance Equipment used:  (2 person with gait belt) Transfers: Stand Pivot Transfers   Stand pivot transfers: Total assist;+2 physical assistance;+2 safety/equipment       General transfer comment: pt unable to fully stand (~3/4 standing) bearing some weight on her legs  Ambulation/Gait             General Gait Details: w/c bound PTA  Stairs            Wheelchair Mobility    Modified Rankin (Stroke Patients Only)       Balance Overall balance assessment: Needs assistance Sitting-balance support: No upper extremity supported;Feet supported Sitting balance-Leahy Scale: Poor Sitting balance - Comments: did maintain her balance with close-guard assist x 3 minutes; as fatigued she begins to lean forward into trunk flexion Postural control:  (anterior lean)                                   Pertinent Vitals/Pain Pain Assessment: No/denies pain    Home Living Family/patient expects to be discharged to:: Assisted living               Home Equipment: Wheelchair - manual      Prior Function Level of Independence: Needs assistance   Gait / Transfers Assistance  Needed: able to sit up in w/c 6 am-7 pm; pivot with +2 assist  ADL's / Homemaking Assistance Needed: could self-feed finger food  Comments: Spoke with [name], RN at her ALF for above information. She stated pt needs to be able to safely sit up in a w/c (with no specialty/positioning cushions, lap belts, etc) for 3-4 hours. She is concerned re: pt possibly pitching forward out of chair.     Hand Dominance        Extremity/Trunk Assessment   Upper Extremity Assessment: Generalized weakness;RUE deficits/detail;LUE deficits/detail RUE Deficits /  Details: limited shoullder ROM     LUE Deficits / Details: limited shoullder ROM   Lower Extremity Assessment: Generalized weakness;RLE deficits/detail;LLE deficits/detail RLE Deficits / Details: full ankle DF; knee flexion contracture at 30; hip flexion contracture 30 LLE Deficits / Details: full ankle DF; knee flexion contracture at 45; hip flexion contracture 30  Cervical / Trunk Assessment: Kyphotic (can reverse with assist/positioning)  Communication   Communication: Expressive difficulties (severe dementia)  Cognition Arousal/Alertness: Awake/alert (once aroused/awakened) Behavior During Therapy: WFL for tasks assessed/performed (smiles appropriately) Overall Cognitive Status: No family/caregiver present to determine baseline cognitive functioning (apparently can recognize dtrs PTA)                      General Comments      Exercises Other Exercises Other Exercises: PROM shoulders, elbows, hips, knees, and ankles      Assessment/Plan    PT Assessment All further PT needs can be met in the next venue of care  PT Diagnosis Generalized weakness;Altered mental status   PT Problem List Decreased strength;Decreased range of motion;Decreased activity tolerance;Decreased balance;Decreased mobility;Decreased cognition  PT Treatment Interventions     PT Goals (Current goals can be found in the Care Plan section) Acute Rehab PT Goals Patient Stated Goal: unable PT Goal Formulation: Patient unable to participate in goal setting    Frequency     Barriers to discharge        Co-evaluation               End of Session Equipment Utilized During Treatment: Gait belt Activity Tolerance: Patient tolerated treatment well Patient left: in chair;with nursing/sitter in room Nurse Communication: Mobility status;Other (comment) (fall risk)         Time:  -      Charges:   PT Evaluation $Initial PT Evaluation Tier I: 1 Procedure     PT G Codes:         [name] December 21, 2014, 9:38 AM  Pager [phone]

## 2016-02-24 DEATH — deceased

## 2016-10-11 IMAGING — MR MR HEAD W/O CM
10 of 11 series · 35 of 48 positions shown · non-contrast
Comparison: CT head 10/27/2011

CLINICAL DATA: Stroke.  Altered mental status, dementia.

EXAM:
MRI HEAD WITHOUT CONTRAST
TECHNIQUE: Multiplanar, multiecho pulse sequences of the brain and surrounding
structures were obtained without intravenous contrast.

[Series 3: DWI · axial · 3.6mm · 0.94mm/px · z∈[-74,+75]mm · 9 of 86 slices shown (1 of 4)]
[im 1/86]
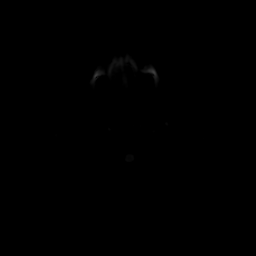
[im 11/86]
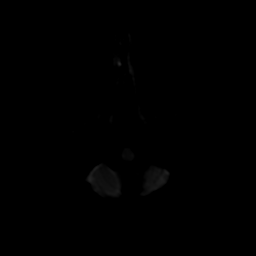
[im 22/86]
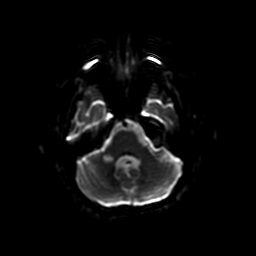
[im 32/86]
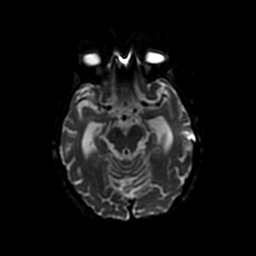
[im 43/86]
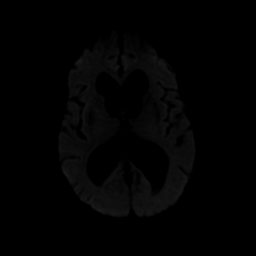
[im 54/86]
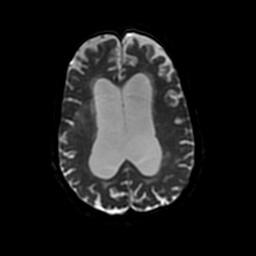
[im 64/86]
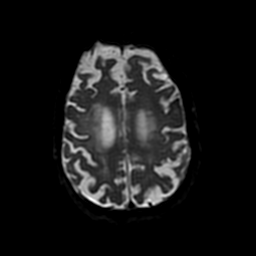
[im 75/86]
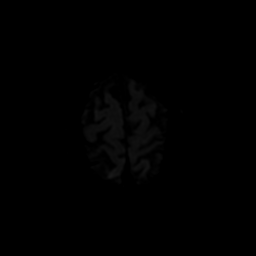
[im 86/86]
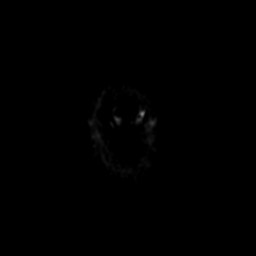

[Series 6: FLAIR · axial · 5.0mm · 0.47mm/px · z∈[-74,+74]mm · 2 of 26 slices shown (1 of 2)]
[im 1/26]
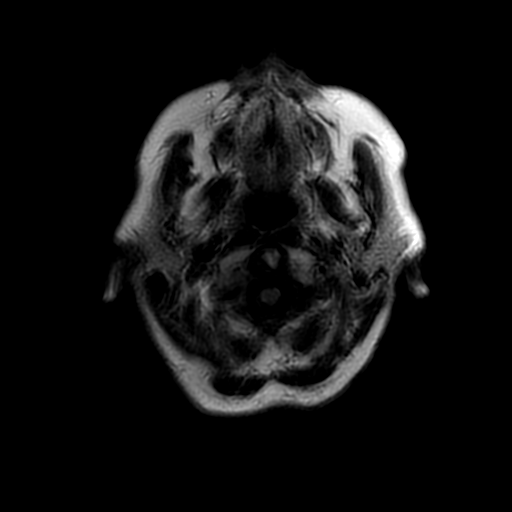
[im 26/26]
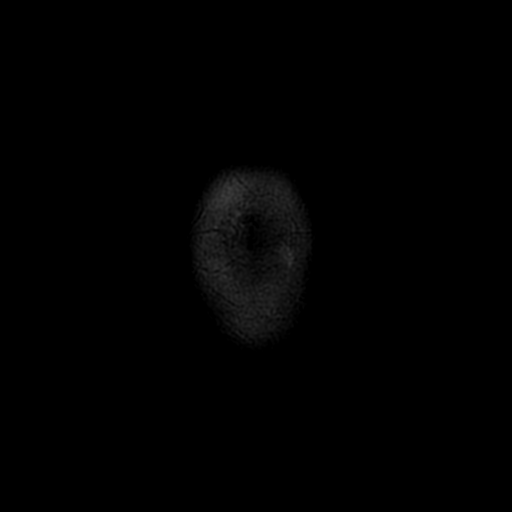

[Series 8: (person_name) · axial · 3.0mm · 0.47mm/px · 1 of 104 slices shown]
[im 1/104]
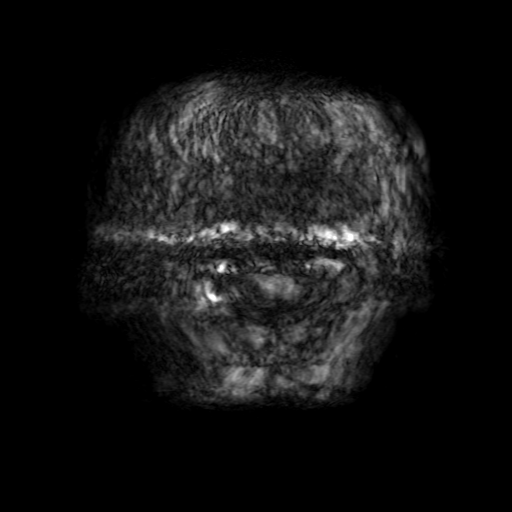

[Series 9: T2 · coronal · 5.0mm · 0.39mm/px · 3 of 31 slices shown (1 of 2)]
[im 1/31]
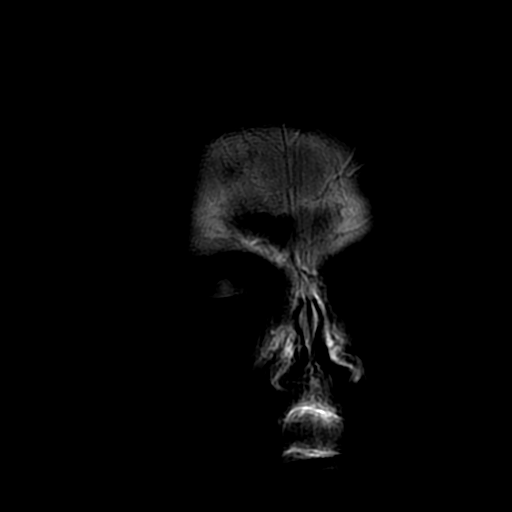
[im 16/31]
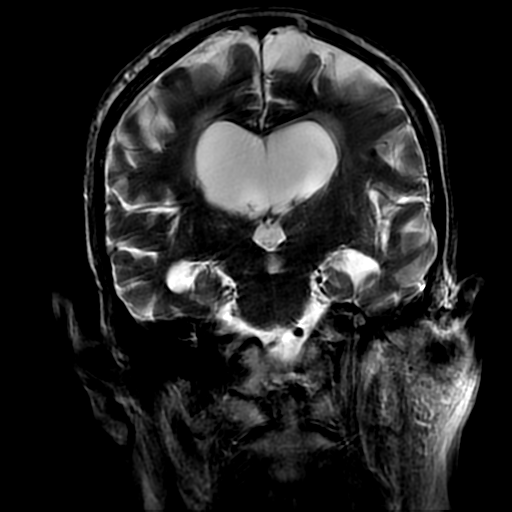
[im 31/31]
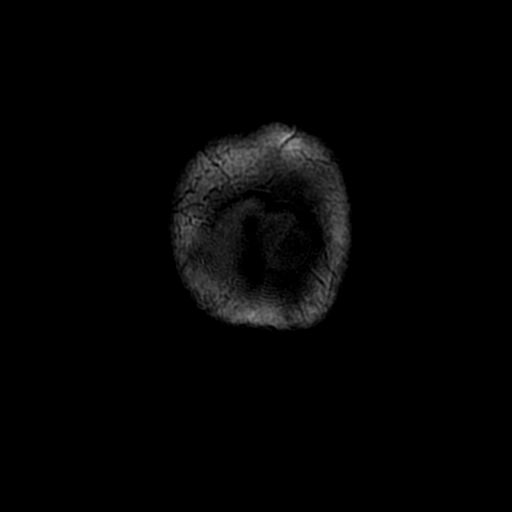

[Series 10: T2 · axial · 5.0mm · 0.47mm/px · z∈[-74,+74]mm · 2 of 26 slices shown (2 of 2)]
[im 1/26]
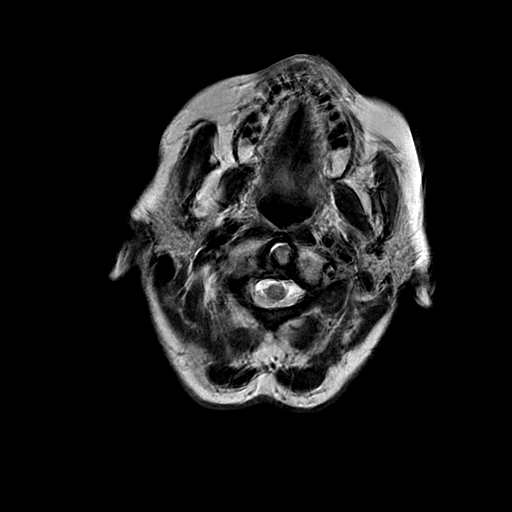
[im 26/26]
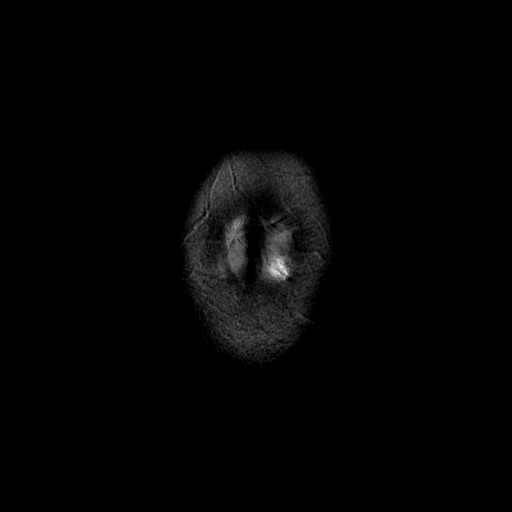

[Series 12: FLAIR · sagittal · 5.0mm · 0.47mm/px · 2 of 24 slices shown (2 of 2)]
[im 1/24]
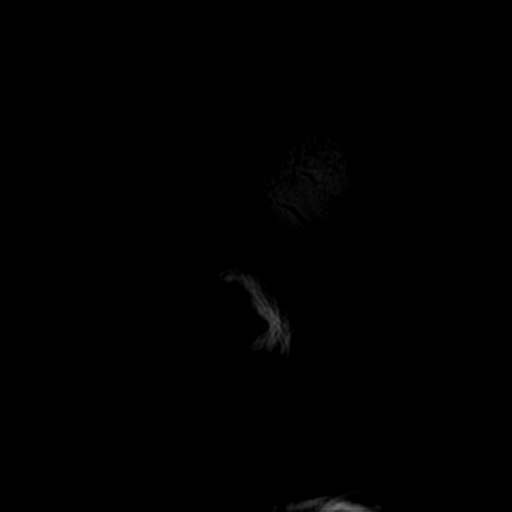
[im 24/24]
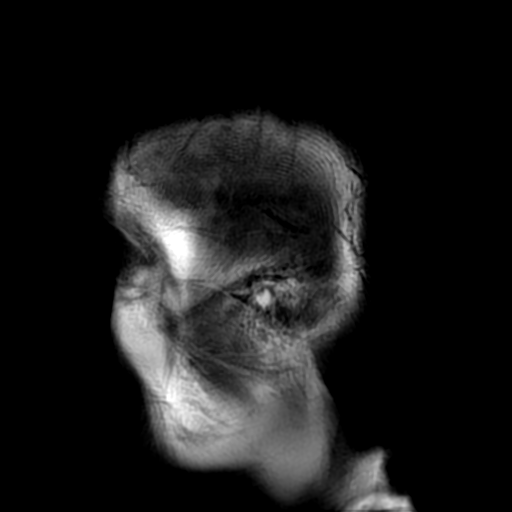

[Series 13: GRE · axial · 5.0mm · 0.47mm/px · z∈[-75,+74]mm · 2 of 26 slices shown]
[im 1/26]
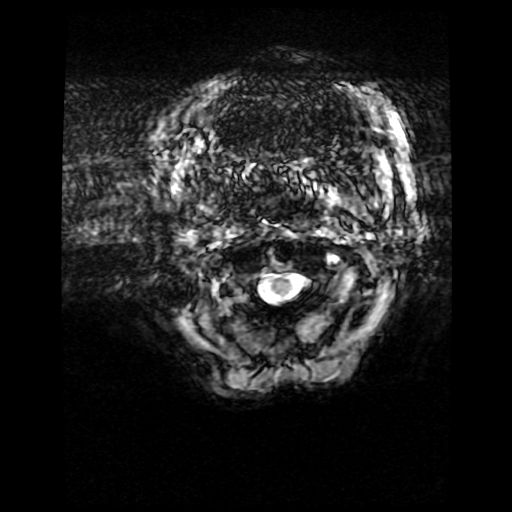
[im 26/26]
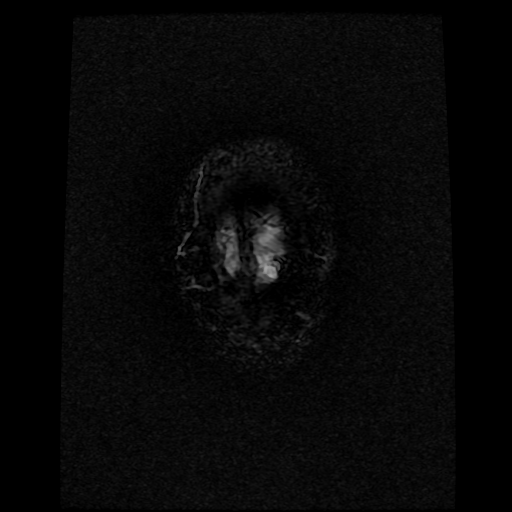

[Series 14: DWI · coronal · 5.0mm · 0.94mm/px · 7 of 74 slices shown (2 of 4)]
[im 1/74]
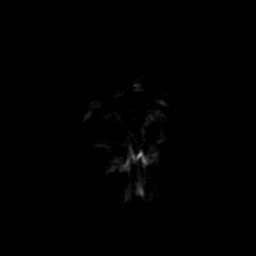
[im 13/74]
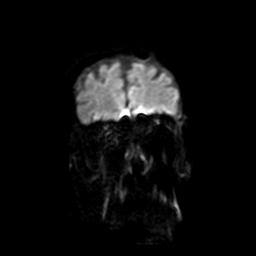
[im 25/74]
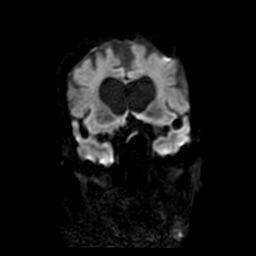
[im 37/74]
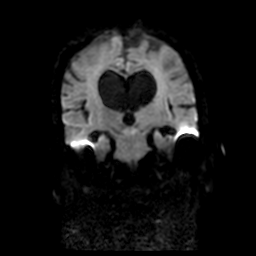
[im 49/74]
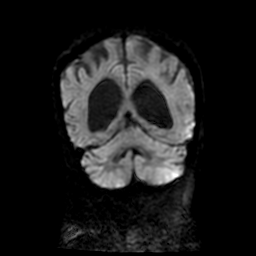
[im 61/74]
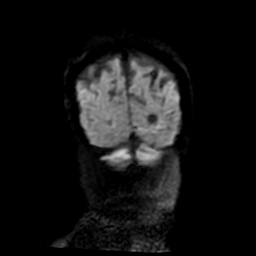
[im 74/74]
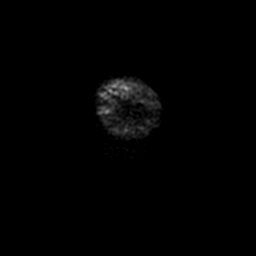

[Series 300: DWI · axial · 3.6mm · 0.94mm/px · z∈[-74,+75]mm · 4 of 43 slices shown (3 of 4)]
[im 1/43]
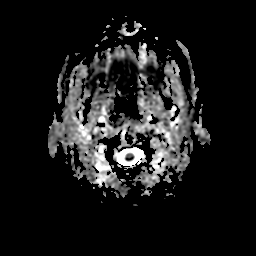
[im 15/43]
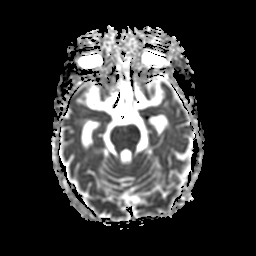
[im 29/43]
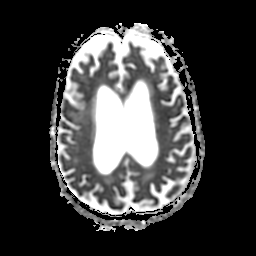
[im 43/43]
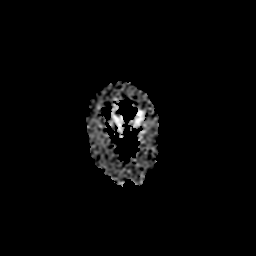

[Series 1400: DWI · coronal · 5.0mm · 0.94mm/px · 3 of 37 slices shown (4 of 4)]
[im 1/37]
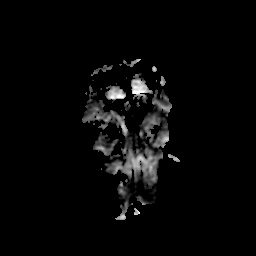
[im 19/37]
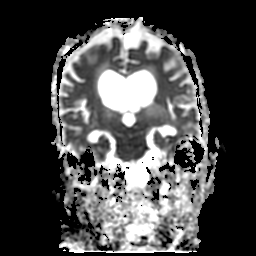
[im 37/37]
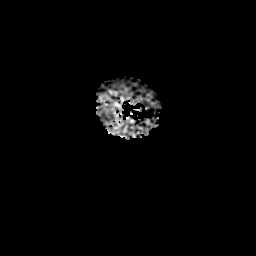

[35 of 48 positions shown; findings below may reference images not displayed]

FINDINGS: Progressive enlargement of the ventricles which are now moderately
dilated. Previously there were bilateral subdural hematomas with
effacement of the subarachnoid space. Subarachnoid spaces are more
prominent on today's study. It is possible this is related to
progressive atrophy however I think there is more likely an element
of communicating hydrocephalus present. This may represent normal
pressure hydrocephalus.

Negative for acute infarct. Mild chronic microvascular ischemic
change in the white matter. Chronic lesion in the right brachium
pontis most likely is an area of chronic ischemia. Brainstem and
cerebellum intact.

Negative for mass lesion.

Small areas of chronic micro hemorrhage in the right occipital
parietal lobe and left parietal lobe.

Interval resolution of bilateral subdural hematomas.
IMPRESSION: Negative for acute infarct. Mild to moderate chronic microvascular
ischemic change in the white matter.

Ventricles are moderately large. There is an element of atrophy
however the ventricles are larger than expected for atrophy raising
the possibility of normal pressure hydrocephalus.
